# Patient Record
Sex: Male | Born: 1960 | Race: White | Hispanic: No | Smoking: Never smoker
Health system: Southern US, Community
[De-identification: ages and names within clinical notes are randomized; demographics above are authoritative.]

## PROBLEM LIST (undated history)

## (undated) DIAGNOSIS — K429 Umbilical hernia without obstruction or gangrene: Secondary | ICD-10-CM

## (undated) DIAGNOSIS — G4733 Obstructive sleep apnea (adult) (pediatric): Secondary | ICD-10-CM

## (undated) DIAGNOSIS — D751 Secondary polycythemia: Secondary | ICD-10-CM

## (undated) DIAGNOSIS — N4289 Other specified disorders of prostate: Secondary | ICD-10-CM

## (undated) DIAGNOSIS — K2270 Barrett's esophagus without dysplasia: Secondary | ICD-10-CM

## (undated) DIAGNOSIS — J302 Other seasonal allergic rhinitis: Secondary | ICD-10-CM

## (undated) DIAGNOSIS — K219 Gastro-esophageal reflux disease without esophagitis: Secondary | ICD-10-CM

## (undated) DIAGNOSIS — I10 Essential (primary) hypertension: Principal | ICD-10-CM

## (undated) DIAGNOSIS — E291 Testicular hypofunction: Secondary | ICD-10-CM

## (undated) DIAGNOSIS — E7800 Pure hypercholesterolemia, unspecified: Secondary | ICD-10-CM

## (undated) HISTORY — DX: Other specified disorders of prostate: N42.89

## (undated) HISTORY — DX: Other seasonal allergic rhinitis: J30.2

## (undated) HISTORY — DX: Obstructive sleep apnea (adult) (pediatric): G47.33

## (undated) HISTORY — DX: Umbilical hernia without obstruction or gangrene: K42.9

## (undated) HISTORY — DX: Secondary polycythemia: D75.1

## (undated) HISTORY — DX: Essential (primary) hypertension: I10

## (undated) HISTORY — PX: INGUINAL HERNIA REPAIR: SHX194

## (undated) HISTORY — DX: Gastro-esophageal reflux disease without esophagitis: K21.9

## (undated) HISTORY — DX: Pure hypercholesterolemia, unspecified: E78.00

## (undated) HISTORY — DX: Barrett's esophagus without dysplasia: K22.70

## (undated) HISTORY — DX: Testicular hypofunction: E29.1

---

## 1999-09-01 HISTORY — PX: VASECTOMY: SHX75

## 2001-05-01 ENCOUNTER — Emergency Department (HOSPITAL_COMMUNITY): Admission: EM | Admit: 2001-05-01 | Discharge: 2001-05-01 | Payer: Self-pay | Admitting: Emergency Medicine

## 2001-05-03 ENCOUNTER — Ambulatory Visit (HOSPITAL_COMMUNITY): Admission: AD | Admit: 2001-05-03 | Discharge: 2001-05-03 | Payer: Self-pay | Admitting: General Surgery

## 2004-01-27 ENCOUNTER — Emergency Department (HOSPITAL_COMMUNITY): Admission: EM | Admit: 2004-01-27 | Discharge: 2004-01-27 | Payer: Self-pay

## 2004-04-28 ENCOUNTER — Encounter: Admission: RE | Admit: 2004-04-28 | Discharge: 2004-04-28 | Payer: Self-pay | Admitting: Family Medicine

## 2005-11-08 ENCOUNTER — Emergency Department (HOSPITAL_COMMUNITY): Admission: EM | Admit: 2005-11-08 | Discharge: 2005-11-08 | Payer: Self-pay | Admitting: *Deleted

## 2007-01-03 ENCOUNTER — Ambulatory Visit: Payer: Self-pay | Admitting: Family Medicine

## 2007-01-03 ENCOUNTER — Encounter (INDEPENDENT_AMBULATORY_CARE_PROVIDER_SITE_OTHER): Payer: Self-pay | Admitting: Family Medicine

## 2007-01-03 ENCOUNTER — Encounter: Payer: Self-pay | Admitting: Family Medicine

## 2007-01-03 DIAGNOSIS — J309 Allergic rhinitis, unspecified: Secondary | ICD-10-CM | POA: Insufficient documentation

## 2007-01-27 ENCOUNTER — Ambulatory Visit: Payer: Self-pay | Admitting: Family Medicine

## 2007-02-22 IMAGING — CR DG ABDOMEN 2V
3 series · 3 of 3 positions shown · non-contrast
Comparison: none

CLINICAL DATA: Epigastric pain and vomiting

Abdomen 2 view:
No previous for comparison. No free air. Small bowel decompressed. Normal
distribution of gas and stool throughout the nondistended colon. No abnormal
abdominal calcifications. Visualized bones unremarkable.

[w abdomen upright (1 of 2)]
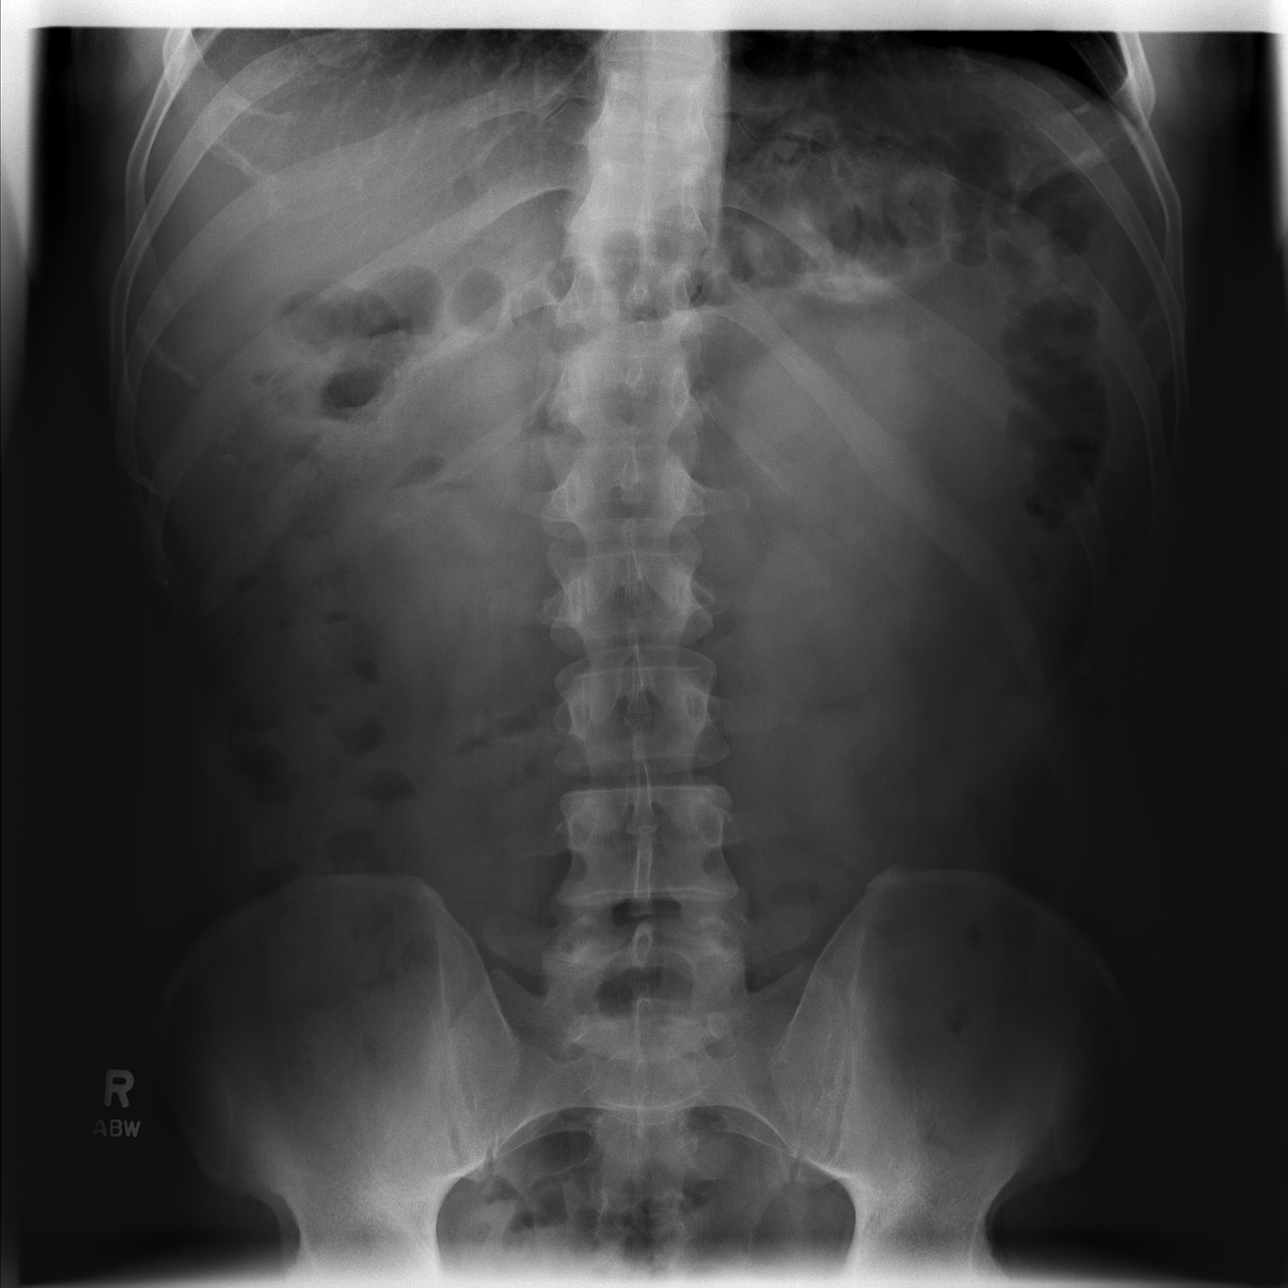

[w abdomen upright (2 of 2)]
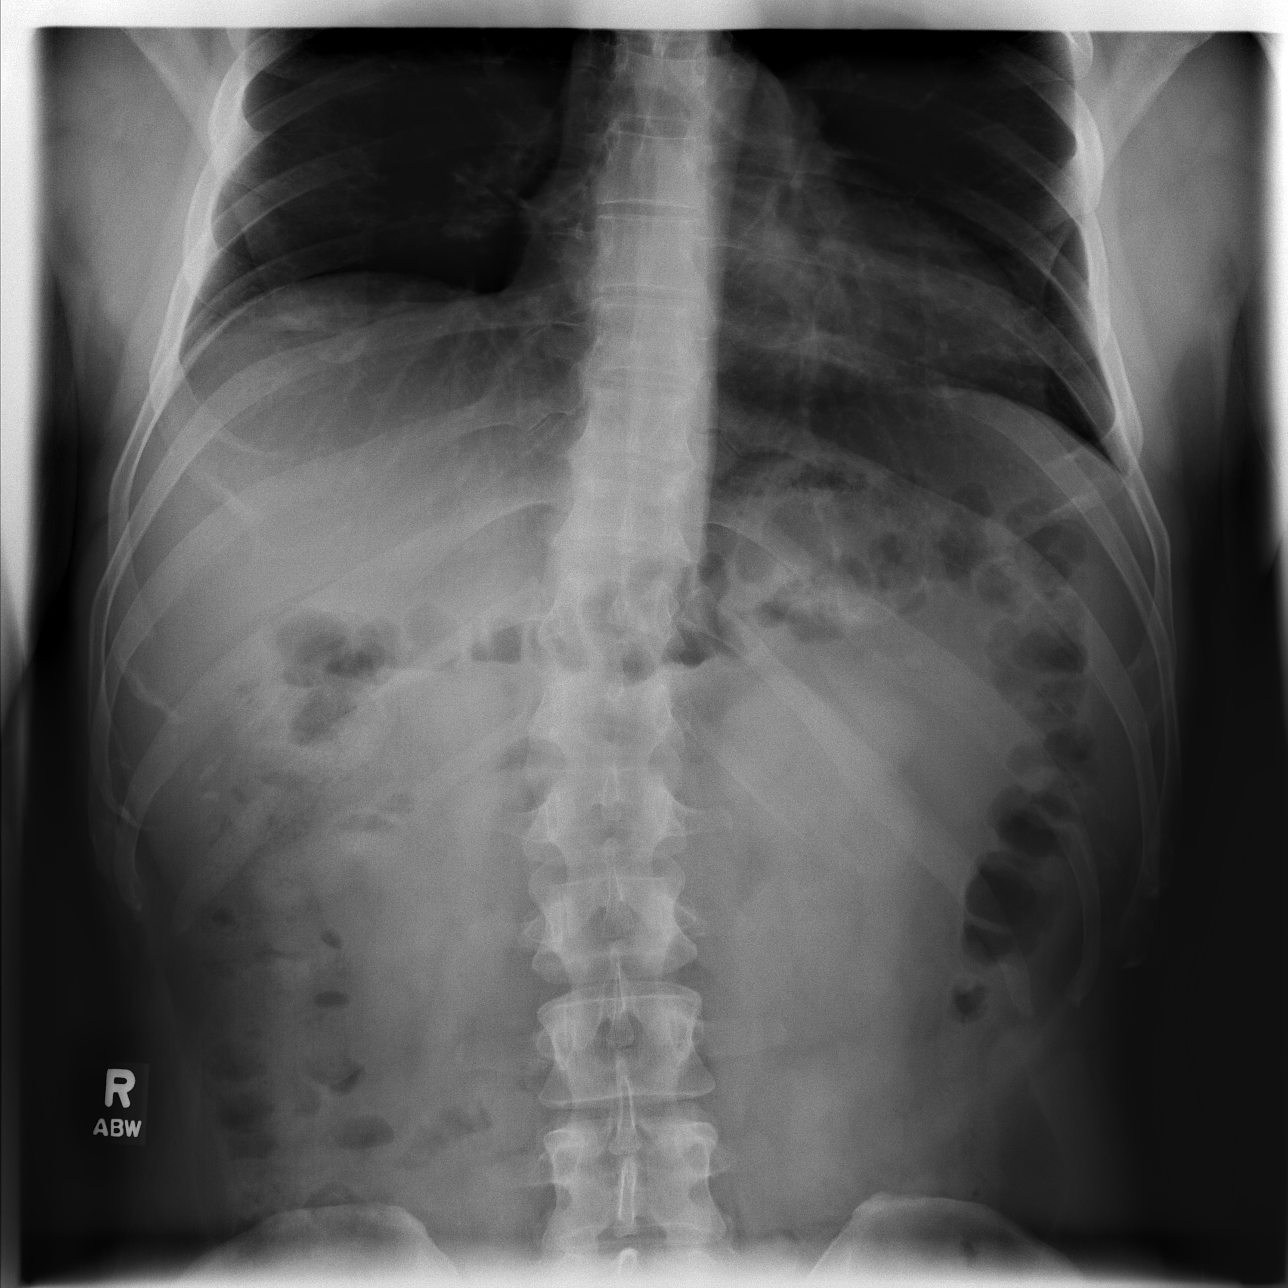

[t abdomen supine]
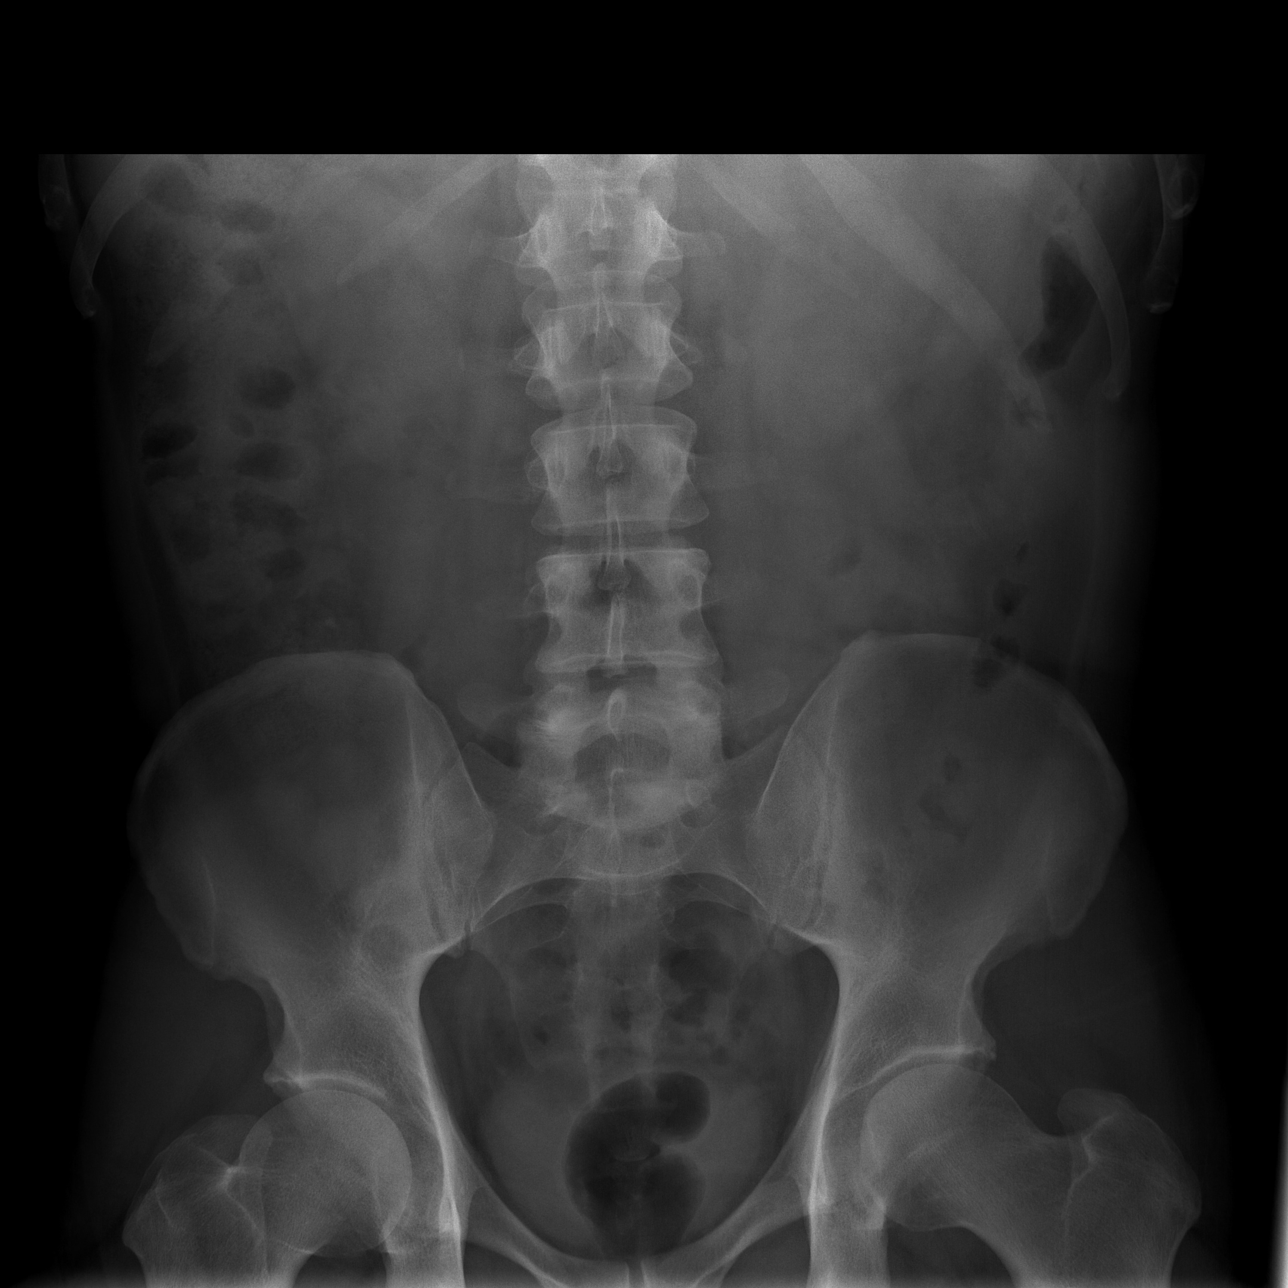

[3 of 3 positions shown; findings below may reference images not displayed]

IMPRESSION: 1. Normal bowel gas pattern. No free air.

## 2007-05-18 ENCOUNTER — Telehealth (INDEPENDENT_AMBULATORY_CARE_PROVIDER_SITE_OTHER): Payer: Self-pay | Admitting: *Deleted

## 2010-03-10 ENCOUNTER — Emergency Department (HOSPITAL_COMMUNITY): Admission: EM | Admit: 2010-03-10 | Discharge: 2010-03-10 | Payer: Self-pay | Admitting: Family Medicine

## 2010-10-22 ENCOUNTER — Encounter: Payer: Self-pay | Admitting: Family Medicine

## 2010-10-22 ENCOUNTER — Ambulatory Visit (INDEPENDENT_AMBULATORY_CARE_PROVIDER_SITE_OTHER): Payer: 59 | Admitting: Family Medicine

## 2010-10-22 DIAGNOSIS — R5383 Other fatigue: Secondary | ICD-10-CM

## 2010-10-22 DIAGNOSIS — M542 Cervicalgia: Secondary | ICD-10-CM | POA: Insufficient documentation

## 2010-10-22 DIAGNOSIS — R5381 Other malaise: Secondary | ICD-10-CM

## 2010-10-22 DIAGNOSIS — S0990XA Unspecified injury of head, initial encounter: Secondary | ICD-10-CM | POA: Insufficient documentation

## 2010-10-27 ENCOUNTER — Encounter: Payer: Self-pay | Admitting: *Deleted

## 2010-10-28 NOTE — Assessment & Plan Note (Signed)
Summary: New pt /dt [employer]   Vital Signs:  Patient profile:   50 year old male Height:      70.5 inches (179.07 cm) Weight:      187.50 pounds (85.23 kg) BMI:     26.62 O2 Sat:      100 % on Room air Temp:     97.7 degrees F (36.50 degrees C) oral Pulse rate:   63 / minute BP sitting:   136 / 87  (right arm) Cuff size:   large  Vitals Entered By: Josph Macho RMA (October 22, 2010 2:41 PM)  O2 Flow:  Room air CC: Fell on Monday, hit back of head on concrete- pt states he vomitted twice afterwards, dull headache, dizzy, pain going down front and back of neck, tired, and irritable/ CF Is Patient Diabetic? No   History of Present Illness: Patient is a 50 yo Caucasian male in today for evaluation of HA and neck pain s/p head trauma. On she was walking his dog on Monday morning February 28 when he slipped on some black ice fell backwards and struck the top of his head as the first thing that had begun. He got up and immediately had a headache and point tenderness with home felt nauseous vomited twice but then says the rest is stable and relatively normally. He never experienced any syncope, memory loss, confusion, visual changes, laceration this, tinnitus or ear pain. He denies any radicular symptoms, chest pain, palpitations, shortness of breath. He is in today because he notes over the last 24 hours his neck has gotten increasingly more uncomfortable. Initially it didn't really bother him denies having pain and spasm down the back of his neck as well as on bilateral sides. Ice and Tylenol provides some rel. He has tenderness to palpation the time of his head but otherwise no significant headache and as previously noted no visual changes. No bony tenderness or any injury to other joints is noted denies any ecchymosis. Is complaining of some intermittent right lower quadrant pain in the region where he had an inguinal hernia repair years ago. This pain is not worsening and he is moving his  bowels daily although he does have to strain at times he denies any bloody or tarry stool, fevers, chills current, recent illness or GU concerns  Preventive Screening-Counseling & Management  Alcohol-Tobacco     Smoking Status: never  Caffeine-Diet-Exercise     Does Patient Exercise: yes  Current Medications (verified): 1)  Multivitamins  Tabs (Multiple Vitamin) .... Once Daily  Allergies (verified): No Known Drug Allergies  Past History:  Past Surgical History: Inguinal herniorrhaphy, on right, mesh in place Vasectomy, 2001  Family History: Father: 46, arthritis Mother: 35, hypotension Siblings:  Brother: 59, A&W Sister:55, A&W Twin Brother: 17, A&W Sister: 71,  A&W Sister: 23, A&W MGM: deceased@95 , old age MGF: deceased@86 , old age PGM: deceased@87 , old age PGF: deceased@86 , old age Children: Son: 13yo, A&W Son: 11yo, A&W  Social History: Occupation:Self employed Married with 2 children Never Smoked Alcohol use-yes:socially Drug use-no Wears seat belt regularly No dietary restrictions Regular exercise-yes, gym, cardio Does Patient Exercise:  yes  Review of Systems  The patient denies anorexia, fever, weight loss, weight gain, vision loss, decreased hearing, hoarseness, chest pain, syncope, dyspnea on exertion, peripheral edema, prolonged cough, headaches, hemoptysis, abdominal pain, melena, hematochezia, severe indigestion/heartburn, hematuria, incontinence, muscle weakness, suspicious skin lesions, transient blindness, difficulty walking, unusual weight change, and enlarged lymph nodes.    Physical Exam  General:  Well-developed,well-nourished,in no acute distress; alert,appropriate and cooperative throughout examination Head:  Normocephalic and atraumatic without obvious abnormalities. No apparent alopecia or balding. Mildly tender over top of scalp on right side Eyes:  No corneal or conjunctival inflammation noted. EOMI. Perrla. Funduscopic exam benign,  without hemorrhages, exudates or papilledema. Vision grossly normal. Ears:  External ear exam shows no significant lesions or deformities.  Otoscopic examination reveals clear canals, tympanic membranes are intact bilaterally without bulging, retraction, inflammation or discharge. Hearing is grossly normal bilaterally. Nose:  External nasal examination shows no deformity or inflammation. Nasal mucosa are pink and moist without lesions or exudates. Mouth:  Oral mucosa and oropharynx without lesions or exudates.  Teeth in good repair. Neck:  No deformities, masses, or tenderness noted. Lungs:  Normal respiratory effort, chest expands symmetrically. Lungs are clear to auscultation, no crackles or wheezes. Heart:  Normal rate and regular rhythm. S1 and S2 normal without gallop, murmur, click, rub or other extra sounds. Abdomen:  Bowel sounds positive,abdomen soft and non-tender without masses, organomegaly or hernias noted. Msk:  No deformity or scoliosis noted of thoracic or lumbar spine.   Pulses:  R and L carotid,dorsalis pedis and posterior tibial pulses are full and equal bilaterally Extremities:  No clubbing, cyanosis, edema, or deformity noted with normal full range of motion of all joints.   Neurologic:  No cranial nerve deficits noted. Station and gait are normal. Plantar reflexes are down-going bilaterally. DTRs are symmetrical throughout. Sensory, motor and coordinative functions appear intact. Skin:  Intact without suspicious lesions or rashes Cervical Nodes:  No lymphadenopathy noted Psych:  Cognition and judgment appear intact. Alert and cooperative with normal attention span and concentration. No apparent delusions, illusions, hallucinations   Impression & Recommendations:  Problem # 1:  HEAD TRAUMA, BLUNT (ICD-959.01) Patient fell on the ice on 10/20/2010 while walking his dogs, he slipped and his feet went out and he hit the top of his head very hard, initially he experienced some  nauseaand vomitting immediately but then that subsided. He never experienced any syncope, confusion, memory loss, visual changes. Over the past 24 hours his biggest concern has been increased neck pain. Seek immediate care if memory loss, visual changes, worsening HA, syncope or any other concerns, will hold off on CT scan unless symptoms worsen  Problem # 2:  NECK PAIN, ACUTE (ICD-723.1)  His updated medication list for this problem includes:    Meloxicam 15 Mg Tabs (Meloxicam) .Marland Kitchen... 1 tab by mouth daily as needed pain    Cyclobenzaprine Hcl 10 Mg Tabs (Cyclobenzaprine hcl) .Marland Kitchen... 1 tab by mouth at bedtime as needed pain  Orders: T-Cervical Spine Comp w/Flex & Ext (16109UE)  Whiplash, recommend alternate ice and heat and avoid any strenuous activities until xray is complete. Seek immediate care if any radicular symptoms or worsening pain develops.  Problem # 3:  FATIGUE (ICD-780.79) Patient is encouraged to get annual labs including FLP, CMP, CBC, TSH, PSA and testosterone levels checked, he agrees to call when he is ready to proceed, he is also encouraged to proceed with screening colonoscopy and he will notify us if he would like to proceed  Complete Medication List: 1)  Multivitamins Tabs (Multiple vitamin) .... Once daily 2)  Meloxicam 15 Mg Tabs (Meloxicam) .Marland Kitchen.. 1 tab by mouth daily as needed pain 3)  Cyclobenzaprine Hcl 10 Mg Tabs (Cyclobenzaprine hcl) .Marland Kitchen.. 1 tab by mouth at bedtime as needed pain  Patient Instructions: 1)  Please schedule a follow-up appointment in  1 year for annual exam 2)  Alternate ice and heat, seek immediate care if worsening HA, neurologic changes occur 3)  Call for labs, including FLP, CMP, CBC, testosterone and PSA Prescriptions: CYCLOBENZAPRINE HCL 10 MG TABS (CYCLOBENZAPRINE HCL) 1 tab by mouth at bedtime as needed pain  #30 x 1   Entered and Authorized by:   Danise Edge MD   Signed by:   Danise Edge MD on 10/22/2010   Method used:   Electronically to          [facility]  [place] [address] (retail)       [address]       [place], [place]  [address]       Ph: [phone] or [phone]       Fax: [phone]   RxID:   [id number] MELOXICAM 15 MG TABS (MELOXICAM) 1 tab by mouth daily as needed pain  #30 x 1   Entered and Authorized by:   Danise Edge MD   Signed by:   Danise Edge MD on 10/22/2010   Method used:   [facility]ronically to        [facility]  [place] [address] (retail)       [address]       [place], [place]  [address]       Ph: [phone] or [phone]       Fax: [phone]   RxID:   [id number]    Orders Added: 1)  T-Cervical Spine Comp w/Flex & Ext [72052TC] 2)  New Patient Level III [25427]    Preventive Care Screening  Last Tetanus Booster:    Date:  09/01/2007    Results:  Historical

## 2010-11-13 ENCOUNTER — Telehealth: Payer: Self-pay | Admitting: Family Medicine

## 2010-11-14 ENCOUNTER — Telehealth: Payer: Self-pay | Admitting: Family Medicine

## 2010-11-18 NOTE — Progress Notes (Signed)
Summary: Wants referral  Phone Note Call from Patient Call back at [phone]   Caller: Patient Reason for Call: Talk to Nurse Summary of Call: Pt is still not feeling "right", feels like he needs to be referred to a specialist Initial call taken by: Lannette Donath,  November 13, 2010 2:09 PM  Follow-up for Phone Call        OK but need to know what he means, is it the HA, is it neck pain is he having trouble with confusion or something else altogether. He was supposed to go for a  neck xray did he? and if he is still having HA or mentation difficulties he needs a CT head while he waits on Neuro referral Follow-up by: Danise Edge MD,  November 14, 2010 9:01 AM  Additional Follow-up for Phone Call Additional follow up Details #1::        No pt did not go for the xray. Pt states the pain (in his neck) got better and then started hurting again.  Additional Follow-up by: Josph Macho RMA,  November 14, 2010 10:20 AM    Additional Follow-up for Phone Call Additional follow up Details #2::    So is the neck whathe wants the referral for? If so I can send him to ortho but he should still get an xray while he is waiting for the appt because it will speed his referral along when he gets there. Follow-up by: Danise Edge MD,  November 14, 2010 11:00 AM  Additional Follow-up for Phone Call Additional follow up Details #3:: Details for Additional Follow-up Action Taken: Left pt a detailed message informing him of the importance of getting the xray.  referral is done, SAB Additional Follow-up by: Josph Macho RMA,  November 14, 2010 11:17 AM

## 2010-11-18 NOTE — Telephone Encounter (Signed)
Pt informed to go to [employer] [facility]

## 2010-11-18 NOTE — Telephone Encounter (Signed)
Left a message for pt to return my call. 

## 2011-01-16 NOTE — Op Note (Signed)
[facility]  Patient:    Ryan Dodson, Ryan Dodson Visit Number: [id number] MRN: [id number]          Service Type: DSU Location: DAY Attending Physician:  Carson Myrtle Proc. Date: 05/03/01 Admit Date:  05/03/2001                             Operative Report  PREOPERATIVE DIAGNOSIS:  Acute right inguinal hernia.  POSTOPERATIVE DIAGNOSIS:  Right direct inguinal hernia.  OPERATION PERFORMED:   Repair with mesh.  SURGEON:  Timothy E. Earlene Plater, M.D.  ANESTHESIA:  General.  INDICATIONS FOR PROCEDURE:  Mr. Cassatt presented in the office three days ago with acute nauseating pain in the right groin. A hernia was easily diagnosable. It did not appear to be incarcerated and certainly not strangulated. He was placed on restrictions and the surgery was scheduled urgently at his request. This has been carefully explained to him in detail as well as his wife.  DESCRIPTION OF PROCEDURE:  The patient was brought to the operating room, placed supine, general endotracheal anesthesia administered. The right groin was shaved, prepped and draped in the usual fashion. A curvilinear incision made after Marcaine 0.5% with epinephrine was injected throughout the field. The subcutaneous tissue dissected, the bleeding points cauterized, the external oblique dissected in line with its fibers and opened through the external ring. It was reflected medial and lateral with care taken to reflect the ilioinguinal nerve with the medial leaf and it was kept out of the dissection. The cord structures appeared normal and were dissected from the floor of the canal. The floor of the canal was moderately herniated. At the neck of the internal ring, there was a large lipoma. This was dissected back to the tip of the peritoneum which was clearly seen and there was on indirect sac. This lipoma was reduced. A small plug of mesh was placed in the anterior medial aspect of the internal ring and  sewn in place with interrupted 2-0 Prolenes. A flat piece of mesh was cut and fashioned to fit the floor of the canal and tied down to the surrounding with sturdy structures with interrupted #0 Prolene. This completed the repair. The cord passed through the mesh and lay in its anatomic position. The ilioinguinal nerve had been avoided. The external oblique was closed with running 2-0 Vicryl, deep subcu 2-0 Vicryl, skin 3-0 monocryl subcuticular. Counts correct. The patient tolerated the procedure well. The 1/2 inch Steri-Strips and dry sterile dressings applied. Second count is correct. He was awakened and taken to the recovery room in good condition. Attending Physician:  Carson Myrtle DD:  05/03/01 TD:  05/03/01 Job: [id number]

## 2011-01-27 ENCOUNTER — Telehealth: Payer: Self-pay | Admitting: Family Medicine

## 2011-01-27 NOTE — Telephone Encounter (Signed)
Neysa Bonito, please get the CPT code for the test from Dr Abner Greenspan, patient would like a quote for the price of the test

## 2011-01-27 NOTE — Telephone Encounter (Signed)
Patient said Dr Abner Greenspan had mentioned having his testosterone levels checked, please let patient know if he needs to make an appt with the doctor or whether he needs a lab visit

## 2011-01-28 NOTE — Telephone Encounter (Signed)
Please advise 

## 2011-01-28 NOTE — Telephone Encounter (Signed)
MD states he can make a lab appt

## 2011-01-28 NOTE — Telephone Encounter (Signed)
ICD9 for testosterone check will be 780.9/fatigue

## 2011-01-28 NOTE — Telephone Encounter (Signed)
Please print the note from [employer], it did not transfer over, I will need to see that to get the ICD 9 code

## 2011-02-03 NOTE — Telephone Encounter (Signed)
I spoke with Ryan Dodson, she said the charge for CPT 364.15 venipuncture is $12 and CPT 364.16 finger stick is also $12, but the patient will also incur a charge from [employer]

## 2011-02-03 NOTE — Telephone Encounter (Signed)
I spoke with patient, he scheduled lab appt, patient is aware he will also get a bill from [employer]

## 2011-02-04 NOTE — Telephone Encounter (Signed)
Patient has been advised that we cannot get a price from [employer]

## 2011-02-05 ENCOUNTER — Ambulatory Visit: Payer: 59

## 2011-02-05 DIAGNOSIS — E291 Testicular hypofunction: Secondary | ICD-10-CM

## 2011-02-06 MED ORDER — TESTOSTERONE CYPIONATE 100 MG/ML IM SOLN: 100.0000 mg | INTRAMUSCULAR | Status: DC

## 2011-02-09 ENCOUNTER — Ambulatory Visit (INDEPENDENT_AMBULATORY_CARE_PROVIDER_SITE_OTHER): Payer: 59 | Admitting: Family Medicine

## 2011-02-09 ENCOUNTER — Encounter: Payer: Self-pay | Admitting: Family Medicine

## 2011-02-09 VITALS — BP 146/82 | HR 92 | Temp 98.0°F | Ht 70.5 in | Wt 184.8 lb

## 2011-02-09 DIAGNOSIS — E291 Testicular hypofunction: Secondary | ICD-10-CM

## 2011-02-09 DIAGNOSIS — R5381 Other malaise: Secondary | ICD-10-CM

## 2011-02-09 DIAGNOSIS — E349 Endocrine disorder, unspecified: Secondary | ICD-10-CM | POA: Insufficient documentation

## 2011-02-09 DIAGNOSIS — Z8639 Personal history of other endocrine, nutritional and metabolic disease: Secondary | ICD-10-CM

## 2011-02-09 DIAGNOSIS — R5383 Other fatigue: Secondary | ICD-10-CM

## 2011-02-09 HISTORY — DX: Testicular hypofunction: E29.1

## 2011-02-09 MED ORDER — TESTOSTERONE CYPIONATE 100 MG/ML IM SOLN
100.0000 mg | INTRAMUSCULAR | Status: DC
  Administered 2011-02-09: 100 mg via INTRAMUSCULAR

## 2011-02-09 NOTE — Assessment & Plan Note (Signed)
Likely related to his low testosterone levels, will stasrt therapy and monitor

## 2011-02-09 NOTE — Assessment & Plan Note (Addendum)
Given testosterone dose today, 100mg  IM now, will give another shot in 2 weeks and then reassess symptoms and consider going to weekly shots after that. Patient is aware of potential side effects and would like to proceed. Patient may choose to administer his own shots in the future if he tolerates the dose.

## 2011-02-09 NOTE — Progress Notes (Signed)
Ryan Dodson [id number] Sep 19, 1960 02/09/2011      Progress Note-Follow Up  Subjective  Chief Complaint  Chief Complaint  Patient presents with  . Follow-up    low testosterone f/up and injection    HPI  Patient is in today for first Testosterone injection. Testosterone level last week was in the 240s and he is anxious to start shots. He acknowledges that it back in the 1980s when he was weight lifting he was doing testosterone injections himself. He reports he tolerated it well and he had more energy than he has had any other time in his life. He denies any recent history of fevers, chills, headache, chest pain, palpitations, shortness of breath, GI or GU complaints at this time.  Past Medical History  Diagnosis Date  . Allergy     Rhinitis  . 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 Vasectomy 2001  . Inguinal hernia repair     on right, mesh in place    Family History  Problem Relation Age of Onset  . Hypotension Mother   . Arthritis Father     History   Social History  . Marital Status: Married    Spouse Name: N/A    Number of Children: N/A  . Years of Education: N/A   Occupational History  . Not on file.   Social History Main Topics  . Smoking status: Never Smoker   . Smokeless tobacco: Not on file  . Alcohol Use: 0.0 oz/week    0 drink(s) per week     socially  . Drug Use: No  . Sexually Active: Not on file   Other Topics Concern  . Not on file   Social History Narrative  . No narrative on file    Current Outpatient Prescriptions on File Prior to Visit  Medication Sig Dispense Refill  . cyclobenzaprine (FLEXERIL) 10 MG tablet Take 10 mg by mouth at bedtime as needed.        . meloxicam (MOBIC) 15 MG tablet Take 15 mg by mouth daily as needed. For pain       . multivitamin (THERAGRAN) per tablet Take 1 tablet by mouth daily.        Marland Kitchen testosterone cypionate (DEPOTESTOTERONE CYPIONATE) 100 MG/ML injection Inject 1  mL (100 mg total) into the muscle every 14 (fourteen) days. 1 ml given X 8 weeks.  10 mL  0   No current facility-administered medications on file prior to visit.    Not on File  Review of Systems  Review of Systems  Constitutional: Negative for fever and malaise/fatigue.  HENT: Negative for congestion.   Eyes: Negative for discharge.  Respiratory: Negative for shortness of breath.   Cardiovascular: Negative for chest pain, palpitations and leg swelling.  Gastrointestinal: Negative for nausea, abdominal pain and diarrhea.  Genitourinary: Negative for dysuria.  Musculoskeletal: Negative for falls.  Skin: Negative for rash.  Neurological: Negative for loss of consciousness and headaches.  Endo/Heme/Allergies: Negative for polydipsia.  Psychiatric/Behavioral: Negative for depression and suicidal ideas. The patient is not nervous/anxious and does not have insomnia.     Objective  BP 146/82  Pulse 92  Temp(Src) 98 F (36.7 C) (Oral)  Ht 5' 10.5" (1.791 m)  Wt 184 lb 12.8 oz (83.825 kg)  BMI 26.14 kg/m2  SpO2 98%  Physical Exam  Physical Exam  Constitutional: He is oriented to person, place, and time and well-developed, well-nourished, and in no distress. No distress.  HENT:  Head: Normocephalic and atraumatic.  Eyes: Conjunctivae are normal.  Neck: Neck supple. No thyromegaly present.  Cardiovascular: Normal rate, regular rhythm and normal heart sounds.   No murmur heard. Pulmonary/Chest: Effort normal and breath sounds normal. No respiratory distress.  Abdominal: He exhibits no distension and no mass. There is no tenderness.  Musculoskeletal: He exhibits no edema.  Neurological: He is alert and oriented to person, place, and time.  Skin: Skin is warm.  Psychiatric: Memory, affect and judgment normal.      Assessment & Plan  Low testosterone Given testosterone dose today, 100mg  IM now, will give another shot in 2 weeks and then reassess symptoms and consider going  to weekly shots after that. Patient is aware of potential side effects and would like to proceed. Patient may choose to administer his own shots in the future if he tolerates the dose.  FATIGUE Likely related to his low testosterone levels, will stasrt therapy and monitor

## 2011-02-11 MED ORDER — TESTOSTERONE CYPIONATE 100 MG/ML IM SOLN: 100.0000 mg | INTRAMUSCULAR | Status: DC

## 2011-02-23 ENCOUNTER — Telehealth: Payer: Self-pay

## 2011-02-23 ENCOUNTER — Ambulatory Visit (INDEPENDENT_AMBULATORY_CARE_PROVIDER_SITE_OTHER): Payer: 59

## 2011-02-23 DIAGNOSIS — E291 Testicular hypofunction: Secondary | ICD-10-CM

## 2011-02-23 MED ORDER — TESTOSTERONE CYPIONATE 100 MG/ML IM SOLN
100.0000 mg | INTRAMUSCULAR | Status: DC
  Administered 2011-02-23: 100 mg via INTRAMUSCULAR

## 2011-02-23 NOTE — Telephone Encounter (Signed)
Pt was in today for his 2nd testosterone injection. Pt would like to know if he should go to once weekly starting next week? Should he come in for labs to see where his numbers are?

## 2011-02-23 NOTE — Telephone Encounter (Signed)
Not worth his time to come in for a repeat level yet. Would wait at least until mid July but as long as he has not any trouble with his initial dose, he can certainly increase dosing to weekly but keep the strength the same

## 2011-02-23 NOTE — Telephone Encounter (Signed)
Left a detailed message on pt's voicemail  

## 2011-03-02 ENCOUNTER — Telehealth: Payer: Self-pay | Admitting: *Deleted

## 2011-03-02 ENCOUNTER — Ambulatory Visit (INDEPENDENT_AMBULATORY_CARE_PROVIDER_SITE_OTHER): Payer: 59 | Admitting: *Deleted

## 2011-03-02 DIAGNOSIS — E291 Testicular hypofunction: Secondary | ICD-10-CM

## 2011-03-02 MED ORDER — TESTOSTERONE CYPIONATE 100 MG/ML IM SOLN
100.0000 mg | Freq: Once | INTRAMUSCULAR | Status: AC
  Administered 2011-03-02: 100 mg via INTRAMUSCULAR

## 2011-03-02 NOTE — Telephone Encounter (Signed)
Pt was in today for testosterone injection.  Pt wants to know if he also needs HCG level checked and possible supplementation.  Advised pt I would ask and Dr. Abner Greenspan will be back in the office on 03/09/11. Pt will be coming for his next injection on that day.  Pt is aware Dr. Abner Greenspan out of the office until 03/09/11 and does not need an answer prior to that time.

## 2011-03-09 NOTE — Telephone Encounter (Signed)
So if he wants to have one drawn we can do that but I do not generally run them on men and I never manage them in med. So my two concerns are one will his insurance consider it warranted and thus pay for it and my second concern is if it is off he would have to be referred for management. As long he is OK with those two things I will put in an order to have it drawn.

## 2011-03-09 NOTE — Telephone Encounter (Signed)
I have attempted to contact this patient by phone with the following results: left message to return my call on answering machine (home).  Out of office reply on mobile voicemail.

## 2011-03-17 ENCOUNTER — Ambulatory Visit (INDEPENDENT_AMBULATORY_CARE_PROVIDER_SITE_OTHER): Payer: 59

## 2011-03-17 DIAGNOSIS — E291 Testicular hypofunction: Secondary | ICD-10-CM

## 2011-03-17 MED ORDER — TESTOSTERONE CYPIONATE 100 MG/ML IM SOLN
100.0000 mg | INTRAMUSCULAR | Status: DC
  Administered 2011-03-17: 100 mg via INTRAMUSCULAR

## 2011-03-26 NOTE — Telephone Encounter (Signed)
Pt did not return call and unable to reach at mobile number.  Pt made appt while at last injection appt.  Follow up ended.

## 2011-03-31 ENCOUNTER — Ambulatory Visit: Payer: 59

## 2011-03-31 ENCOUNTER — Encounter: Payer: Self-pay | Admitting: Family Medicine

## 2011-03-31 ENCOUNTER — Ambulatory Visit (INDEPENDENT_AMBULATORY_CARE_PROVIDER_SITE_OTHER): Payer: 59 | Admitting: Family Medicine

## 2011-03-31 VITALS — BP 124/84 | HR 70 | Temp 98.0°F | Ht 70.5 in | Wt 190.8 lb

## 2011-03-31 DIAGNOSIS — E349 Endocrine disorder, unspecified: Secondary | ICD-10-CM

## 2011-03-31 DIAGNOSIS — R5383 Other fatigue: Secondary | ICD-10-CM

## 2011-03-31 DIAGNOSIS — R5381 Other malaise: Secondary | ICD-10-CM

## 2011-03-31 DIAGNOSIS — E291 Testicular hypofunction: Secondary | ICD-10-CM

## 2011-03-31 MED ORDER — "NEEDLE (DISP) 22G X 1-1/2"" MISC": Status: DC

## 2011-03-31 MED ORDER — TESTOSTERONE CYPIONATE 100 MG/ML IM SOLN
100.0000 mg | Freq: Once | INTRAMUSCULAR | Status: AC
  Administered 2011-03-31: 100 mg via INTRAMUSCULAR

## 2011-03-31 NOTE — Assessment & Plan Note (Signed)
Patient notes overall great improvement in his mood, energy and his overall feeling of good health since we started his testosterone injections. 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 He is notified that this practitioner does not have any personal x-rays with hCG and does not prescribe it. He is given a referral to urology so we can discuss his options were fully. He is given his standard testosterone injection today we will recheck his level in about 3 weeks. He would like to start his injections at home and this will be provided. Now that he has had multiple injections and tolerated them well he is instructed on techniques at today's visit and will call or followup if he has any further questions

## 2011-03-31 NOTE — Assessment & Plan Note (Signed)
Patient has yet to have fasting labs done for baseline here. He declines today but he says he is about to all those labs drawn by his insurance company for by insurance. He agrees to provide [place] with a copy of those labs so that we have baseline labs available for him the fatigue is notably better since starting testosterone.

## 2011-03-31 NOTE — Progress Notes (Signed)
Ryan Dodson [id number] 1960-10-21 03/31/2011      Progress Note-Follow Up  Subjective  Chief Complaint  Chief Complaint  Patient presents with  . Follow-up    discuss HCG  . Injections    testosterone    HPI  Patient is a 50 year old Caucasian male who is in today for his biweekly testosterone injection. He is in today to request that we start prescribing hCG to him. 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 He had spoken with some acquaintances who had suggested beta hCG. No other physical complaints no recent illness, fevers, chills, headache, chest pain, palpitations, shortness of breath, GI or GU complaints noted today.  Past Medical History  Diagnosis Date  . Allergy     Rhinitis  . 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 Vasectomy 2001  . Inguinal hernia repair     on right, mesh in place    Family History  Problem Relation Age of Onset  . Hypotension Mother   . Arthritis Father     History   Social History  . Marital Status: Married    Spouse Name: N/A    Number of Children: N/A  . Years of Education: N/A   Occupational History  . Not on file.   Social History Main Topics  . Smoking status: Never Smoker   . Smokeless tobacco: Former Neurosurgeon    Quit date: 08/31/1986  . Alcohol Use: 0.0 oz/week    0 drink(s) per week     socially  . Drug Use: No  . Sexually Active: Not on file   Other Topics Concern  . Not on file   Social History Narrative  . No narrative on file    Current Outpatient Prescriptions on File Prior to Visit  Medication Sig Dispense Refill  . testosterone cypionate (DEPOTESTOTERONE CYPIONATE) 100 MG/ML injection Inject 1 mL (100 mg total) into the muscle every 14 (fourteen) days. 1 ml given X 8 weeks.  10 mL  0  .  multivitamin (THERAGRAN) per tablet Take 1 tablet by mouth daily.         No current facility-administered medications on file prior to visit.    No Known Allergies  Review of Systems  Review of Systems  Constitutional: Negative for fever and malaise/fatigue.  HENT: Negative for congestion.   Eyes: Negative for discharge.  Respiratory: Negative for shortness of breath.   Cardiovascular: Negative for chest pain, palpitations and leg swelling.  Gastrointestinal: Negative for nausea, abdominal pain and diarrhea.  Genitourinary: Negative for dysuria, urgency and frequency.  Musculoskeletal: Negative for falls.  Skin: Negative for rash.  Neurological: Negative for loss of consciousness and headaches.  Endo/Heme/Allergies: Negative for polydipsia.  Psychiatric/Behavioral: Negative for depression and suicidal ideas. The patient is not nervous/anxious and does not have insomnia.     Objective  BP 139/88  Pulse 70  Temp(Src) 98 F (36.7 C) (Oral)  Ht 5' 10.5" (1.791 m)  Wt 190 lb 12.8 oz (86.546 kg)  BMI 26.99 kg/m2  SpO2 97%  Physical Exam  Physical Exam  Constitutional: He is oriented to person, place, and time and well-developed, well-nourished, and in no distress. No distress.       Repeat BP 124/84  HENT:  Head: Normocephalic and atraumatic.  Eyes: Conjunctivae are  normal.  Neck: Neck supple. No thyromegaly present.  Cardiovascular: Normal rate, regular rhythm and normal heart sounds.   No murmur heard. Pulmonary/Chest: Effort normal and breath sounds normal. No respiratory distress.  Abdominal: He exhibits no distension and no mass. There is no tenderness.  Musculoskeletal: He exhibits no edema.  Neurological: He is alert and oriented to person, place, and time.  Skin: Skin is warm.  Psychiatric: Memory, affect and judgment normal.      Assessment & Plan  Low testosterone Patient notes overall great improvement in his mood, energy and his overall feeling of  good health since we started his testosterone injections. 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 He is notified that this practitioner does not have any personal x-rays with hCG and does not prescribe it. He is given a referral to urology so we can discuss his options were fully. He is given his standard testosterone injection today we will recheck his level in about 3 weeks. He would like to start his injections at home and this will be provided. Now that he has had multiple injections and tolerated them well he is instructed on techniques at today's visit and will call or followup if he has any further questions  FATIGUE Patient has yet to have fasting labs done for baseline here. He declines today but he says he is about to all those labs drawn by his insurance company for by insurance. He agrees to provide [place] with a copy of those labs so that we have baseline labs available for him the fatigue is notably better since starting testosterone.

## 2011-04-06 ENCOUNTER — Ambulatory Visit (INDEPENDENT_AMBULATORY_CARE_PROVIDER_SITE_OTHER): Payer: 59 | Admitting: Family Medicine

## 2011-04-06 ENCOUNTER — Encounter: Payer: Self-pay | Admitting: Family Medicine

## 2011-04-06 VITALS — BP 139/87 | HR 91 | Temp 98.6°F | Ht 70.5 in | Wt 189.8 lb

## 2011-04-06 DIAGNOSIS — Z0000 Encounter for general adult medical examination without abnormal findings: Secondary | ICD-10-CM

## 2011-04-06 DIAGNOSIS — R1011 Right upper quadrant pain: Secondary | ICD-10-CM

## 2011-04-06 DIAGNOSIS — K219 Gastro-esophageal reflux disease without esophagitis: Secondary | ICD-10-CM | POA: Insufficient documentation

## 2011-04-06 DIAGNOSIS — K429 Umbilical hernia without obstruction or gangrene: Secondary | ICD-10-CM

## 2011-04-06 DIAGNOSIS — R1013 Epigastric pain: Secondary | ICD-10-CM | POA: Insufficient documentation

## 2011-04-06 HISTORY — DX: Gastro-esophageal reflux disease without esophagitis: K21.9

## 2011-04-06 HISTORY — DX: Umbilical hernia without obstruction or gangrene: K42.9

## 2011-04-06 LAB — CBC WITH DIFFERENTIAL/PLATELET
Basophils Absolute: 0 10*3/uL (ref 0.0–0.1)
Basophils Relative: 0 % (ref 0–1)
Hemoglobin: 16.4 g/dL (ref 13.0–17.0)
Lymphocytes Relative: 18 % (ref 12–46)
MCH: 31.5 pg (ref 26.0–34.0)
MCHC: 33.7 g/dL (ref 30.0–36.0)
MCV: 93.5 fL (ref 78.0–100.0)
Neutro Abs: 6.6 10*3/uL (ref 1.7–7.7)
Neutrophils Relative %: 71 % (ref 43–77)
RBC: 5.2 MIL/uL (ref 4.22–5.81)

## 2011-04-06 MED ORDER — RANITIDINE HCL 300 MG PO TABS: 300.0000 mg | ORAL_TABLET | Freq: Every day | ORAL | Status: DC

## 2011-04-06 NOTE — Patient Instructions (Signed)
Costochondritis (Costochondral Separation / Tietze Syndrome) Costochondritis (Tietze syndrome), or costochondral separation, is a swelling and irritation (inflammation) of the tissue (cartilage) that connects your ribs with your breastbone (sternum). It may occur on its own (spontaneously), through damage caused by an accident (trauma), or simply from coughing or minor exercise. It may take up to 6 weeks to get better and longer if you are unable to be conservative in your activities. HOME CARE INSTRUCTIONS Avoid exhausting physical activity. Try not to strain your ribs during normal activity. This would include any activities using chest, belly (abdominal) and side muscles, especially if heavy weights are used.  Use ice for 15 minutes per hour while awake for the first 2 days. Place the ice in a plastic bag, and place a towel between the bag of ice and your skin.  Only take over-the-counter or prescription medicines for pain, discomfort, or fever as directed by your caregiver.  SEEK IMMEDIATE MEDICAL CARE IF: Your pain increases or you are very uncomfortable.  An oral temperature above 104 develops.  You develop difficulty with your breathing.  You cough up blood.  You develop worse chest pains, shortness of breath, sweating, or vomiting.  You develop new, unexplained problems (symptoms).  MAKE SURE YOU:  Understand these instructions.  Will watch your condition.  Will get help right away if you are not doing well or get worse.  Document Released: 05/27/2005 Document Re-Released: 11/11/2009 [facility] Patient Information 2011 [place], [place].Cholelithiasis, Gallbladder Disease (Gallstones) Gallstones are a form of gallbladder disease. When there is an infection of the gallbladder it is called cholecystitis. It is usually caused by a build-up of stones (gallstones or cholelithiasis) in your gallbladder. The gallbladder is not an essential organ. This means it is not necessary for life. It is  located slightly to the right of center in the belly (abdomen), behind the liver. It stores bile made in the liver. Bile aids in digestion and absorption of fats. Gallbladder disease may result in feeling sick to your stomach (nausea), abdominal pain, and jaundice. In severe cases, emergency surgery may be required. Gallstones are the most common type of gallbladder disease. They begin as small crystals and slowly grow into stones. Gallstone pain occurs when the gallbladder spasms and a gallstone is blocking the duct. Pain can also occur when a stone passes out of the duct. The pain usually begins suddenly. It may persist from several minutes to several hours. Infection can occur. Infection can add to discomfort and severity of an acute attack. The pain may be made worse by breathing deeply or by being jarred. There may be fever and tenderness to the touch. In some cases, when gallstones do not move into the bile duct, people have no pain or symptoms. These are called "silent" gallstones. Women are three times more likely to develop gallstones than men. Women who have had several pregnancies are more likely to have gallbladder disease. Physicians sometimes advise removing diseased gallbladders before future pregnancies. Other factors that increase the risk of gallbladder disease are obesity, diets heavy in fried foods and dairy products, increasing age, prolonged use of medications containing male hormones, and heredity. HOME CARE INSTRUCTIONS  Only take over-the-counter or prescription medicines for pain, discomfort, or fever as directed by your caregiver.   Follow a low fat diet until seen again. (Fat causes the gallbladder to contract.)   Follow-up as instructed. Attacks are almost always recurrent and surgery is usually required for permanent treatment.  SEEK IMMEDIATE MEDICAL CARE IF:  Pain is increasing and is not controlled by medications.   You have an oral temperature above 104, not  controlled by medication.   You develop nausea and vomiting.  MAKE SURE YOU:   Understand these instructions.   Will watch your condition. 104  Will get help right away if you are not doing well or get worse.  Document Released: 08/13/2005 Document Re-Released: 07/30/2008 [facility] Patient Information 2011 [place], [place].Heartburn Heartburn is a painful, burning sensation in the chest. It may feel worse in certain positions, such as lying down or bending over. It is caused by stomach acid backing up into the tube that carries food from the mouth down to the stomach (lower esophagus).  CAUSES A number of conditions can cause or worsen heartburn, including:   Pregnancy.   Being overweight (obesity).   A condition called hiatal hernia, in which part or all of the stomach is moved up into the chest through a weakness in the diaphragm muscle.   Alcohol.   Exercise.   Eating just before going to bed.   Overeating.   Medications, including:   Nonsteroidal anti-inflammatory drugs, such as ibuprofen and naproxen.   Aspirin.   Some blood pressure medicines, including beta-blockers, calcium channel blockers, and alpha-blockers.   Nitrates (used to treat angina).   The asthma medication Theophylline.   Certain sedative drugs.   Heartburn may be worse after eating certain foods. These heartburn-causing foods are different for different people, but may include:   Peppers.   Chocolate.   Coffee.   High-fat foods, including fried foods.   Spicy foods.   Garlic, onions.   Citrus fruits, including oranges, grapefruit, lemons and limes.   Food containing tomatoes or tomato products.   Mint.   Carbonated beverages.   Vinegar.  SYMPTOMS  Symptoms may last for a few minutes or a few hours, and can include:  Burning pain in the chest or lower throat.   Bitter taste in the mouth.   Coughing.  DIAGNOSIS If the usual treatments for heartburn do not improve your  symptoms, then tests may be done to see if there is another condition present. Possible tests may include:  X-rays.   Endoscopy. This is when a tube with a light and a camera on the end is used to examine the esophagus and the stomach.   Blood, breath, or stool tests may be used to check for bacteria that cause ulcers.  TREATMENT There are a number of non-prescription medicines used to treat heartburn, including:  Antacids.   Acid reducers (also called H-2 blockers).   Proton-pump inhibitors.  HOME CARE INSTRUCTIONS  Raise the head of your bed by putting blocks under the legs.   Eat 2-3 hours before going to bed.   Stop smoking.   Try to reach and maintain a healthy weight.   Do not eat just a few very large meals. Instead, eat many smaller meals throughout the day.   Try to identify foods and beverages that make your symptoms worse, and avoid these.   Avoid tight clothing.   Do not exercise right after eating.  SEEK IMMEDIATE MEDICAL CARE IF YOU:  Have severe chest pain that goes down your arm, or into your jaw or neck.   Feel sweaty, dizzy, or lightheaded.   Are short of breath.   Throw up (vomit) blood.   Have difficulty or pain with swallowing.   Have bloody or black, tarry stools.   Have bouts of heartburn more than  three times a week for more than two weeks.  Document Released: 01/03/2009 Document Re-Released: 11/11/2009 [facility] Patient Information 2011 [place], [place].

## 2011-04-06 NOTE — Assessment & Plan Note (Signed)
Tender to palp and he does note some increased symptoms with eating as well. Will have him avoid fatty and spicy foods and proceed with RUQ [place].

## 2011-04-06 NOTE — Assessment & Plan Note (Addendum)
error 

## 2011-04-06 NOTE — Assessment & Plan Note (Signed)
Start Ranitidine 300mg  po daily, avoid offending foods and referred to GI for further evaluation

## 2011-04-06 NOTE — Assessment & Plan Note (Signed)
Very small, nontender, reducible less than a cm and not likely the cause of his current symptoms

## 2011-04-06 NOTE — Progress Notes (Signed)
Ryan Dodson [id number] 26-Jul-1961 04/06/2011      Progress Note-Follow Up  Subjective  Chief Complaint  Chief Complaint  Patient presents with  . Hernia    X 3 days- above belly button    HPI  Is a 50 year old Caucasian male who is in today for evaluation of epigastric and right upper quadrant pain. Initially he coughs when he developed a hernia after working out over the weekend. Last week he was feeling well over the weekend he belches eating to excess but also working out to [place] with heavy lifting and crunches. She has now developed some epigastric and right upper quadrant discomfort. Denies shortness of breath, palpitations, chest pain per se. He does over the weekend he also had increasing loose stools having 4 or 5 loose stools a day as opposed to one. No bloody or tarry stool. Has noted increase epigastric discomfort and dyspepsia over the last 1-2 weeks as well. He watches spicy foods and fatty foods in particular due to his symptoms, eats roughly 10 Rolaids daily. No fevers, chills, congestion, vomiting. He is worried about some mild swelling over his right upper, anterior chest wall. He worries that he has developed an umbilical hernia and does believe he has a hiatal hernia already. He reports he has had an UGI but that was many years ago and he reports he has never had a colonoscopy  Past Medical History  Diagnosis Date  . Allergy     Rhinitis  . 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 Reflux 04/06/2011  . RUQ pain 04/06/2011  . Epigastric pain 04/06/2011  . Hernia, umbilical 04/06/2011    Past Surgical History  Procedure Date  . Vasectomy 2001  . Inguinal hernia repair     on right, mesh in place    Family History  Problem Relation Age of Onset  . Hypotension Mother   . Arthritis Father     History   Social History  . Marital Status: Married    Spouse Name: N/A    Number of Children: N/A  . Years of Education: N/A   Occupational History  . Not on file.   Social  History Main Topics  . Smoking status: Never Smoker   . Smokeless tobacco: Former Neurosurgeon    Quit date: 08/31/1986  . Alcohol Use: 0.0 oz/week    0 drink(s) per week     socially  . Drug Use: No  . Sexually Active: Not on file   Other Topics Concern  . Not on file   Social History Narrative  . No narrative on file    Current Outpatient Prescriptions on File Prior to Visit  Medication Sig Dispense Refill  . multivitamin (THERAGRAN) per tablet Take 1 tablet by mouth daily.        Marland Kitchen NEEDLE, DISP, 22 G (BD DISP NEEDLES) 22G X 1-1/2" MISC 1 dose IM every 2 weeks For testosterone injection  25 each  1  . testosterone cypionate (DEPOTESTOTERONE CYPIONATE) 100 MG/ML injection Inject 1 mL (100 mg total) into the muscle every 14 (fourteen) days. 1 ml given X 8 weeks.  10 mL  0    No Known Allergies  Review of Systems  Review of Systems  Constitutional: Negative for fever and malaise/fatigue.  HENT: Negative for congestion.   Eyes: Negative for discharge.  Respiratory: Negative for shortness of breath.   Cardiovascular: Negative for chest pain, palpitations and leg swelling.  Gastrointestinal: Positive for heartburn, abdominal pain and diarrhea. Negative for vomiting,  constipation, blood in stool and melena.  Genitourinary: Negative for dysuria.  Musculoskeletal: Negative for falls.  Skin: Negative for rash.  Neurological: Negative for loss of consciousness and headaches.  Endo/Heme/Allergies: Negative for polydipsia.  Psychiatric/Behavioral: Negative for depression and suicidal ideas. The patient is not nervous/anxious and does not have insomnia.     Objective  BP 139/87  Pulse 91  Temp(Src) 98.6 F (37 C) (Oral)  Ht 5' 10.5" (1.791 m)  Wt 189 lb 12.8 oz (86.093 kg)  BMI 26.85 kg/m2  SpO2 96%  Physical Exam  Physical Exam  Constitutional: He is oriented to person, place, and time and well-developed, well-nourished, and in no distress. No distress.  HENT:  Head:  Normocephalic and atraumatic.  Eyes: Conjunctivae are normal.  Neck: Neck supple. No thyromegaly present.  Cardiovascular: Normal rate, regular rhythm and normal heart sounds.   No murmur heard. Pulmonary/Chest: Effort normal and breath sounds normal. No respiratory distress.  Abdominal: Bowel sounds are normal. He exhibits no distension. There is tenderness. There is no rebound and no guarding.       Pain with palp over epigastrium and RUQ, hepatic margin noted roughly 1 Fingerbreadth below costal margin, no splenomegaly. With valsava maneuver can feel a small umbilical hernia, less than 1 cm and reducible  Musculoskeletal: He exhibits no edema.  Neurological: He is alert and oriented to person, place, and time.  Skin: Skin is warm.  Psychiatric: Memory, affect and judgment normal.      Assessment & Plan  Epigastric pain Check an H Pylori today, avoid spicy and fatty foods. Patient has some mild point tenderness over the distal sternum but the vast majority of discomfort on palp is soft tissue in the epigastrium and RUQ   RUQ pain Tender to palp and he does note some increased symptoms with eating as well. Will have him avoid fatty and spicy foods and proceed with RUQ [place].   Reflux Start Ranitidine 300mg  po daily, avoid offending foods and referred to GI for further evaluation  Hernia, umbilical Very small, nontender, reducible less than a cm and not likely the cause of his current symptoms

## 2011-04-07 ENCOUNTER — Ambulatory Visit (HOSPITAL_BASED_OUTPATIENT_CLINIC_OR_DEPARTMENT_OTHER)
Admission: RE | Admit: 2011-04-07 | Discharge: 2011-04-07 | Disposition: A | Discharge status: Home / Self Care | Payer: 59 | Source: Ambulatory Visit | Attending: Family Medicine | Admitting: Family Medicine

## 2011-04-07 ENCOUNTER — Encounter: Payer: Self-pay | Admitting: Internal Medicine

## 2011-04-07 DIAGNOSIS — R1011 Right upper quadrant pain: Secondary | ICD-10-CM | POA: Insufficient documentation

## 2011-04-07 DIAGNOSIS — R1013 Epigastric pain: Secondary | ICD-10-CM

## 2011-04-07 DIAGNOSIS — K3189 Other diseases of stomach and duodenum: Secondary | ICD-10-CM | POA: Insufficient documentation

## 2011-04-07 DIAGNOSIS — R142 Eructation: Secondary | ICD-10-CM | POA: Insufficient documentation

## 2011-04-07 DIAGNOSIS — R110 Nausea: Secondary | ICD-10-CM

## 2011-04-07 DIAGNOSIS — R141 Gas pain: Secondary | ICD-10-CM | POA: Insufficient documentation

## 2011-04-07 LAB — HEPATIC FUNCTION PANEL
ALT: 36 U/L (ref 0–53)
AST: 29 U/L (ref 0–37)
Albumin: 4.7 g/dL (ref 3.5–5.2)

## 2011-04-08 ENCOUNTER — Telehealth: Payer: Self-pay

## 2011-04-08 NOTE — Telephone Encounter (Signed)
Patient would like a referral for an Endoscopy?

## 2011-04-08 NOTE — Telephone Encounter (Signed)
Already scheduled

## 2011-04-14 ENCOUNTER — Ambulatory Visit: Payer: 59

## 2011-04-16 ENCOUNTER — Ambulatory Visit (INDEPENDENT_AMBULATORY_CARE_PROVIDER_SITE_OTHER): Payer: 59

## 2011-04-16 DIAGNOSIS — E291 Testicular hypofunction: Secondary | ICD-10-CM

## 2011-04-16 MED ORDER — TESTOSTERONE CYPIONATE 100 MG/ML IM SOLN
100.0000 mg | INTRAMUSCULAR | Status: DC
  Administered 2011-04-16: 100 mg via INTRAMUSCULAR

## 2011-04-16 MED ORDER — TESTOSTERONE CYPIONATE 200 MG/ML IM SOLN: 100.0000 mg | INTRAMUSCULAR | Status: DC

## 2011-04-21 ENCOUNTER — Other Ambulatory Visit: Payer: 59

## 2011-04-27 ENCOUNTER — Telehealth: Payer: Self-pay

## 2011-04-27 DIAGNOSIS — K429 Umbilical hernia without obstruction or gangrene: Secondary | ICD-10-CM

## 2011-04-27 NOTE — Telephone Encounter (Signed)
Patient calling reporting increased periumbilical abdominal pain requesting a referral to surgery for evaluation, will  order

## 2011-04-27 NOTE — Telephone Encounter (Signed)
Referral has been placed. 

## 2011-04-27 NOTE — Telephone Encounter (Signed)
Pt left a message stating he saw the Urologist that was referred for him an dhe has an Umbilical Hernia. Pt states it is hurting really bad and would like md to send a referral to schedule surgery? Please advise?

## 2011-04-30 ENCOUNTER — Encounter: Payer: Self-pay | Admitting: Internal Medicine

## 2011-04-30 ENCOUNTER — Ambulatory Visit (INDEPENDENT_AMBULATORY_CARE_PROVIDER_SITE_OTHER): Payer: 59 | Admitting: Internal Medicine

## 2011-04-30 DIAGNOSIS — R1013 Epigastric pain: Secondary | ICD-10-CM

## 2011-04-30 DIAGNOSIS — K3189 Other diseases of stomach and duodenum: Secondary | ICD-10-CM

## 2011-04-30 DIAGNOSIS — R12 Heartburn: Secondary | ICD-10-CM

## 2011-04-30 NOTE — Patient Instructions (Signed)
You have been given Nexium to take 1 time daily 20-30 minutes before breakfast. We have also sent in a prescription for the Nexium to your pharmacy. You have declined to have a Colonoscopy at this time,  Please call when you are ready to set this up.

## 2011-04-30 NOTE — Progress Notes (Signed)
Subjective:    Patient ID: Ryan Dodson, male    DOB: [birth year], 50 y.o.   MRN: [id number]  HPI Ryan Dodson is a 50 year old male with a past medical history of GERD, and low testosterone who is referred by Dr. Abner Greenspan for evaluation of epigastric abdominal pain and heartburn.  He presents alone today. The patient reports approximately one month of epigastric abdominal pain along with right middle quadrant abdominal pain.  He feels like this is related to "working out" and he states "I think I blew a hernia".  He describes the pain as a "dull" and "driving" pain.  The pain is rather constant but it does worsen with eating. It tends to get better with lying down. He has not had associated nausea and vomiting. He reports he deals with near daily heartburn symptoms. He is using ranitidine 300 mg each bedtime on some nights. This has not been a daily medication for him. He does report occasionally feels like food sticks when he swallows but no trouble with liquids.  No odynophagia.  He reports his bowel movements have been normal for him, formed and brown.  No bright red blood per rectum, or melena.  He does not use NSAIDs.  No fevers chills or night sweats. He reports his appetite has been good and his weight is down about 8 pounds (which he reports is intentional and from calorie restriction).   Review of Systems Constitutional: Negative for fever, chills, night sweats, activity change, appetite change and unexpected weight change HEENT: Negative for sore throat, mouth sores. Eyes: Negative for visual disturbance Respiratory: Negative for cough, chest tightness and shortness of breath Cardiovascular: Negative for chest pain, palpitations and lower extremity swelling Gastrointestinal: See history of present illness Genitourinary: Negative for dysuria and hematuria. Musculoskeletal: Negative for back pain, arthralgias and myalgias Skin: Negative for rash or color change Neurological: Negative for  headaches, weakness, numbness Hematological: Negative for adenopathy, negative for easy bruising/bleeding Psychiatric/behavioral: Negative for depressed mood, negative for anxiety  PMH: GERD Low testosterone Allergic rhinitis  Current outpatient prescriptions:NEEDLE, DISP, 22 G (BD DISP NEEDLES) 22G X 1-1/2" MISC, 1 dose IM every 2 weeks For testosterone injection, Disp: 25 each, Rfl: 1;  ranitidine (ZANTAC) 300 MG tablet, Take 1 tablet (300 mg total) by mouth at bedtime., Disp: 30 tablet, Rfl: 1 testosterone cypionate (DEPOTESTOTERONE CYPIONATE) 100 MG/ML injection, Inject 1 mL (100 mg total) into the muscle every 14 (fourteen) days. 1 ml given X 8 weeks., Disp: 10 mL, Rfl: 0  No Known Allergies  Family History  Problem Relation Age of Onset  . Hypotension Mother   . Arthritis Father   -neg for CRC, colon polyps  Social History  . Marital Status: Married    Spouse Name: N/A    Number of Children: 2   Occupational History  . [employer]    Social History Main Topics  . Smoking status: Never Smoker   . Smokeless tobacco: Former Neurosurgeon    Quit date: 08/31/1986  . Alcohol Use: Socially, <1/day  . Drug Use: No      Objective:   Physical Exam BP 118/72  Pulse 88  Ht 5\' 11"  (1.803 m)  Wt 185 lb (83.915 kg)  BMI 25.80 kg/m2 Constitutional: Well-developed and well-nourished. No distress. HEENT: Normocephalic and atraumatic. Oropharynx is clear and moist. No oropharyngeal exudate. Conjunctivae are normal. Pupils are equal round and reactive to light. No scleral icterus. Neck: Neck supple. Trachea midline. Cardiovascular: Normal rate, regular rhythm and intact distal  pulses. No M/R/G Pulmonary/chest: Effort normal and breath sounds normal. No wheezing, rales or rhonchi. Abdominal: Soft, tender to palp epigastrium and right middle quad, no rebound or guarding.  Non-distended.  ? Small umbilical hernia without incarceration, nondistended. There are no masses palpable. No  hepatosplenomegaly. Lymphadenopathy: No cervical adenopathy noted. Neurological: Alert and oriented to person place and time. Skin: Skin is warm and dry. No rashes noted. Psychiatric: Normal mood and affect. Behavior is normal.  Labs/Imaging: CBC    Component Value Date/Time   WBC 9.2 04/06/2011 1406   RBC 5.20 04/06/2011 1406   HGB 16.4 04/06/2011 1406   HCT 48.6 04/06/2011 1406   PLT 287 04/06/2011 1406   MCV 93.5 04/06/2011 1406   MCH 31.5 04/06/2011 1406   MCHC 33.7 04/06/2011 1406   RDW 12.7 04/06/2011 1406   LYMPHSABS 1.6 04/06/2011 1406   MONOABS 0.7 04/06/2011 1406   EOSABS 0.2 04/06/2011 1406   BASOSABS 0.0 04/06/2011 1406   CMP     Component Value Date/Time   PROT 7.4 04/06/2011 1406   ALBUMIN 4.7 04/06/2011 1406   AST 29 04/06/2011 1406   ALT 36 04/06/2011 1406   ALKPHOS 49 04/06/2011 1406   BILITOT 0.3 04/06/2011 1406   H. Pylori ab NEGATIVE  04/06/11: ABDOMINAL ULTRASOUND COMPLETE  Comparison: None.  Findings:  Gallbladder: No gallstones, gallbladder wall thickening, or  pericholecystic fluid.  Common Bile Duct: Within normal limits in caliber.  Liver: No focal mass lesion identified. Within normal limits in  parenchymal echogenicity.  IVC: Appears normal.  Pancreas: Obscured by bowel gas, limited visualization.  Spleen: Within normal limits in size and echotexture.  Right kidney: Normal in size and parenchymal echogenicity. No  evidence of mass or hydronephrosis.  Left kidney: Normal in size and parenchymal echogenicity. No  evidence of mass or hydronephrosis.  Abdominal Aorta: No aneurysm identified.   IMPRESSION:  Negative abdominal ultrasound. Limited because of obscuring bowel  gas.     Assessment & Plan:  This is a 50 year old male with a past medical history of GERD and low testosterone who presents with epigastric and right sided abdominal pain, along with uncontrolled GERD  1. Abd pain/GERD - the patient's symptoms are most consistent with ulcer-like dyspepsia.  He is  under the impression that this is more likely related to a hernia or abdominal wall injury, however we discussed that PUD can cause his current symptoms.  Given this I will plan EGD for further evaluation. I will start him on PPI with Nexium 40 mg daily. We discussed how best to take this (30 minutes to one hour before his first meal of the day).  This will likely replace the ranitidine, though he could use this when necessary for breakthrough reflux symptoms.   He has been referred to surgery for evaluation of umbilical hernia and this appointment is pending.  At this point my suspicion that this is causing his symptoms his low, but will defer to their expertise.  It EGD is unremarkable will proceed with CT scan of the abdomen.  2. CRC screening - the patient is average risk. We discussed and I have recommended colonoscopy. He reports his PCP and wife want him to do this, but he would like to defer this for now. We'll discuss this again in followup.

## 2011-05-01 ENCOUNTER — Ambulatory Visit: Payer: 59 | Admitting: Internal Medicine

## 2011-05-01 DIAGNOSIS — Z029 Encounter for administrative examinations, unspecified: Secondary | ICD-10-CM

## 2011-05-05 ENCOUNTER — Ambulatory Visit (INDEPENDENT_AMBULATORY_CARE_PROVIDER_SITE_OTHER): Payer: 59 | Admitting: General Surgery

## 2011-05-05 ENCOUNTER — Encounter (INDEPENDENT_AMBULATORY_CARE_PROVIDER_SITE_OTHER): Payer: Self-pay | Admitting: General Surgery

## 2011-05-05 DIAGNOSIS — G8929 Other chronic pain: Secondary | ICD-10-CM | POA: Insufficient documentation

## 2011-05-05 DIAGNOSIS — R1031 Right lower quadrant pain: Secondary | ICD-10-CM

## 2011-05-05 NOTE — Patient Instructions (Signed)
May continue with normal activities Call if bulge worsens or if pain worsens or fever or nausea/vomiting

## 2011-05-05 NOTE — Progress Notes (Signed)
Subjective:     Patient ID: Ryan Dodson, male   DOB: [birth year], 50 y.o.   MRN: [id number]  HPI We are asked to see the patient in consultation by Dr. Reuel Derby to evaluate him for a hernia. The patient is a 50 year old white male who first noted some right-sided abdominal discomfort a couple of months ago after working out heavy squatting at the gym. Since that time he's noticed some intermittent bloating as well as some puffiness of his right abdominal wall. He denies any nausea or vomiting. He does have problems with constipation that has been chronic for him. He denies any fevers or chills. No chest pain or shortness of breath.  Review of Systems  Constitutional: Negative.   HENT: Negative.   Eyes: Negative.   Respiratory: Negative.   Cardiovascular: Negative.   Gastrointestinal: Positive for constipation and abdominal distention.  Genitourinary: Negative.   Musculoskeletal: Negative.   Skin: Negative.   Neurological: Negative.   Hematological: Negative.   Psychiatric/Behavioral: Negative.        Objective:   Physical Exam  Constitutional: He is oriented to person, place, and time. He appears well-developed and well-nourished.  HENT:  Head: Normocephalic and atraumatic.  Eyes: Conjunctivae and EOM are normal. Pupils are equal, round, and reactive to light.  Neck: Normal range of motion. Neck supple.  Cardiovascular: Normal rate, regular rhythm and normal heart sounds.   Pulmonary/Chest: Effort normal and breath sounds normal.  Abdominal: Soft. Bowel sounds are normal. He exhibits no distension. There is tenderness.       Patient has some tenderness lateral to the umbilicus on the right of the abdomen. There is no palpable bulge seen or fascial defect at this location.  Musculoskeletal: Normal range of motion.  Neurological: He is alert and oriented to person, place, and time.  Skin: Skin is warm and dry.  Psychiatric: He has a normal mood and affect. His behavior is  normal.       Assessment:     Right-sided abdominal pain without any physical findings for hernia    Plan:     We will plan to obtain a CT scan of his abdomen and pelvis to look for source of his pain. We will plan to see him back in the next couple weeks to go over the results of the scan.

## 2011-05-06 ENCOUNTER — Ambulatory Visit: Payer: 59

## 2011-05-07 ENCOUNTER — Ambulatory Visit
Admission: RE | Admit: 2011-05-07 | Discharge: 2011-05-07 | Disposition: A | Discharge status: Home / Self Care | Payer: 59 | Source: Ambulatory Visit | Attending: General Surgery | Admitting: General Surgery

## 2011-05-07 ENCOUNTER — Telehealth: Payer: Self-pay | Admitting: Internal Medicine

## 2011-05-07 MED ORDER — IOHEXOL 300 MG/ML  SOLN
100.0000 mL | Freq: Once | INTRAMUSCULAR | Status: AC | PRN
  Administered 2011-05-07: 100 mL via INTRAVENOUS

## 2011-05-07 NOTE — Telephone Encounter (Signed)
I called patient has requested to discuss CT scan. CT scan was ordered by Dr. Carolynne Edouard. Scan was entirely unremarkable. This is reassuring. I informed the patient of the scan results and we will proceed with upper endoscopy as discussed at our clinic visit. We discussed the CT is not perfect for mucosal disease, such as ulcer disease, and thus I still advise proceeding with upper endoscopy. Patient reassured and in agreement with the plan.  Time provided for questions and answers and he thanked me for the call.

## 2011-05-07 NOTE — Telephone Encounter (Signed)
Pt called again and left a number to be called back at- [phone]. Informed pt per the report his CT was negative, but Dr Rhea Belton nay have comments, orders, etc. Pt requested Dr Rhea Belton return his call. Dr Rhea Belton, can you call pt at [phone] to discuss CT scan? Thanks.

## 2011-05-07 NOTE — Telephone Encounter (Signed)
lmom for pt to call back if he likes. The CT exam ended at 1035am per EPIC and the Radiologist hasn't read it.

## 2011-05-08 ENCOUNTER — Ambulatory Visit (INDEPENDENT_AMBULATORY_CARE_PROVIDER_SITE_OTHER): Payer: 59

## 2011-05-08 DIAGNOSIS — E291 Testicular hypofunction: Secondary | ICD-10-CM

## 2011-05-08 MED ORDER — TESTOSTERONE CYPIONATE 100 MG/ML IM SOLN
100.0000 mg | INTRAMUSCULAR | Status: DC
  Administered 2011-05-08: 100 mg via INTRAMUSCULAR

## 2011-05-14 ENCOUNTER — Ambulatory Visit (AMBULATORY_SURGERY_CENTER): Payer: 59 | Admitting: Internal Medicine

## 2011-05-14 ENCOUNTER — Encounter: Payer: Self-pay | Admitting: Internal Medicine

## 2011-05-14 DIAGNOSIS — K2270 Barrett's esophagus without dysplasia: Secondary | ICD-10-CM

## 2011-05-14 DIAGNOSIS — R1031 Right lower quadrant pain: Secondary | ICD-10-CM

## 2011-05-14 DIAGNOSIS — R1013 Epigastric pain: Secondary | ICD-10-CM

## 2011-05-14 HISTORY — PX: ESOPHAGOGASTRODUODENOSCOPY: SHX1529

## 2011-05-14 MED ORDER — SODIUM CHLORIDE 0.9 % IV SOLN: 500.0000 mL | INTRAVENOUS | Status: DC

## 2011-05-14 NOTE — Patient Instructions (Signed)
Please refer to your blue and neon green sheets for instructions regarding diet and activity for the rest of today.  You may resume your medications as you would normally take them.  

## 2011-05-15 ENCOUNTER — Telehealth: Payer: Self-pay | Admitting: *Deleted

## 2011-05-15 NOTE — Telephone Encounter (Signed)

## 2011-05-20 ENCOUNTER — Other Ambulatory Visit: Payer: Self-pay | Admitting: Internal Medicine

## 2011-05-20 ENCOUNTER — Telehealth: Payer: Self-pay | Admitting: Internal Medicine

## 2011-05-20 ENCOUNTER — Encounter: Payer: Self-pay | Admitting: Internal Medicine

## 2011-05-20 MED ORDER — ESOMEPRAZOLE MAGNESIUM 40 MG PO CPDR: 40.0000 mg | DELAYED_RELEASE_CAPSULE | Freq: Every day | ORAL | Status: DC

## 2011-05-20 NOTE — Telephone Encounter (Signed)
lmom for pt to return if needed; ordered his Nexium at [facility].

## 2011-05-21 NOTE — Telephone Encounter (Signed)
Left message on machine to call back regarding Nexium discount cards.  We have cards but they are not good to use with Medicare.

## 2011-05-22 ENCOUNTER — Ambulatory Visit (INDEPENDENT_AMBULATORY_CARE_PROVIDER_SITE_OTHER): Payer: 59

## 2011-05-22 DIAGNOSIS — E291 Testicular hypofunction: Secondary | ICD-10-CM

## 2011-05-22 MED ORDER — TESTOSTERONE CYPIONATE 100 MG/ML IM SOLN
100.0000 mg | INTRAMUSCULAR | Status: DC
  Administered 2011-05-22: 100 mg via INTRAMUSCULAR

## 2011-05-26 NOTE — Telephone Encounter (Signed)
Pt aware and said he has a discount program that he was given by another facility that he is using

## 2011-06-08 ENCOUNTER — Ambulatory Visit (INDEPENDENT_AMBULATORY_CARE_PROVIDER_SITE_OTHER): Payer: 59

## 2011-06-08 DIAGNOSIS — E291 Testicular hypofunction: Secondary | ICD-10-CM

## 2011-06-08 MED ORDER — TESTOSTERONE CYPIONATE 100 MG/ML IM SOLN
100.0000 mg | INTRAMUSCULAR | Status: DC
  Administered 2011-06-08: 100 mg via INTRAMUSCULAR

## 2011-06-23 ENCOUNTER — Other Ambulatory Visit: Payer: Self-pay

## 2011-06-23 MED ORDER — TESTOSTERONE CYPIONATE 100 MG/ML IM SOLN: 100.0000 mg | INTRAMUSCULAR | Status: DC

## 2011-06-23 NOTE — Telephone Encounter (Signed)
OK to refill with 3 rf

## 2012-01-06 ENCOUNTER — Other Ambulatory Visit: Payer: Self-pay | Admitting: Family Medicine

## 2012-01-06 NOTE — Telephone Encounter (Signed)
Patient informed and appt scheduled.

## 2012-01-06 NOTE — Telephone Encounter (Signed)
Needs labs checked before I can refill. He has not come in to have it rechecked since starting the med

## 2012-01-06 NOTE — Telephone Encounter (Signed)
Please advise refill? Last RX wrote on 06-23-11 with 3 refills. Fax to [phone]

## 2012-01-08 ENCOUNTER — Other Ambulatory Visit (INDEPENDENT_AMBULATORY_CARE_PROVIDER_SITE_OTHER): Payer: 59

## 2012-01-08 DIAGNOSIS — R7989 Other specified abnormal findings of blood chemistry: Secondary | ICD-10-CM

## 2012-01-08 DIAGNOSIS — E291 Testicular hypofunction: Secondary | ICD-10-CM

## 2012-01-08 LAB — TESTOSTERONE: Testosterone: 281.8 ng/dL — ABNORMAL LOW (ref 300–890)

## 2012-01-11 ENCOUNTER — Telehealth: Payer: Self-pay

## 2012-01-11 ENCOUNTER — Other Ambulatory Visit: Payer: 59

## 2012-01-11 ENCOUNTER — Other Ambulatory Visit: Payer: Self-pay

## 2012-01-11 MED ORDER — TESTOSTERONE CYPIONATE 100 MG/ML IM SOLN: 100.0000 mg | INTRAMUSCULAR | Status: DC

## 2012-01-11 NOTE — Telephone Encounter (Signed)
Pt left a message wanting to know if MD would let him take 2 ml of his testosterone instead of the 1 ml. I informed patient that that he must take the 1ml of testosterone until he sees Dr Abner Greenspan on Friday. Pt stated that he understood.

## 2012-01-11 NOTE — Telephone Encounter (Signed)
[facility] at fax [phone]

## 2012-01-15 ENCOUNTER — Encounter: Payer: Self-pay | Admitting: Family Medicine

## 2012-01-15 ENCOUNTER — Ambulatory Visit (INDEPENDENT_AMBULATORY_CARE_PROVIDER_SITE_OTHER): Payer: 59 | Admitting: Family Medicine

## 2012-01-15 VITALS — BP 152/81 | HR 91 | Ht 70.5 in | Wt 193.0 lb

## 2012-01-15 DIAGNOSIS — E291 Testicular hypofunction: Secondary | ICD-10-CM

## 2012-01-15 NOTE — Assessment & Plan Note (Signed)
Stable, responds symptomatically to 100 mg dosing but even better to 150mg  dosing q 2 wks. We reviewed the fact that the testosterone blood check needs to be timed 1 week after an injection to get a fairly accurate blood level assessment, so he'll return in the near future to get this blood draw so we can document where the 150mg  q2 wks dosing is putting his testosterone level.  This was ordered as a future order today and he'll make lab appt for this.

## 2012-01-15 NOTE — Progress Notes (Signed)
OFFICE NOTE  01/15/2012  CC:  Chief Complaint  Patient presents with  . Follow-up    discuss testosterone, ? increase dose, last labs 01/08/12     HPI: Patient is a 51 y.o. Caucasian male who is here for routine male hypogonadism follow up. Says "I've never felt better".  Energy, sleep, libido all excellent.   He did 100mg  q2 wk dosing for quite a while and then at some point in the last few months he increased his dose to 150 mg q2wk dosing and felt a bit better than the 100mg  dosing. Says recent testosterone check was done at least 3 weeks after his most recent injection. Taking a vacation seemed to alter his scheduling/timing of testost injection with his injection partner, so he admits to missing a dose and didn't think it would matter much regarding his testosterone level check. No new complaints today.  Pertinent PMH:  Past Medical History  Diagnosis Date  . Allergy     Rhinitis  . 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 Reflux 04/06/2011  . RUQ pain 04/06/2011  . Epigastric pain 04/06/2011  . Hernia, umbilical 04/06/2011    MEDS:  Outpatient Prescriptions Prior to Visit  Medication Sig Dispense Refill  . esomeprazole (NEXIUM) 40 MG capsule Take 1 capsule (40 mg total) by mouth daily.  30 capsule  6  . NEEDLE, DISP, 22 G (BD DISP NEEDLES) 22G X 1-1/2" MISC 1 dose IM every 2 weeks For testosterone injection  25 each  1  . testosterone cypionate (DEPOTESTOTERONE CYPIONATE) 100 MG/ML injection Inject 1 mL (100 mg total) into the muscle every 14 (fourteen) days. 1 ml given X 8 weeks.  10 mL  0  . Esomeprazole Magnesium (NEXIUM PO) Take by mouth daily.          PE: Blood pressure 152/81, pulse 91, height 5' 10.5" (1.791 m), weight 193 lb (87.544 kg). Gen: Alert, well appearing.  Patient is oriented to person, place, time, and situation. CV: RRR, no m/r/g.   LUNGS: CTA bilat, nonlabored resps, good aeration in all lung fields.   IMPRESSION AND PLAN:  Low testosterone Stable, responds  symptomatically to 100 mg dosing but even better to 150mg  dosing q 2 wks. We reviewed the fact that the testosterone blood check needs to be timed 1 week after an injection to get a fairly accurate blood level assessment, so he'll return in the near future to get this blood draw so we can document where the 150mg  q2 wks dosing is putting his testosterone level.  This was ordered as a future order today and he'll make lab appt for this.      FOLLOW UP: prn

## 2012-02-29 ENCOUNTER — Other Ambulatory Visit: Payer: Self-pay

## 2012-02-29 MED ORDER — TESTOSTERONE CYPIONATE 200 MG/ML IM SOLN: 200.0000 mg | INTRAMUSCULAR | Status: DC

## 2012-02-29 NOTE — Telephone Encounter (Signed)
So we can change his prescription to Testosterone injections to 200 mg every 2 weeks, disp #10 ml, 1 refill til seen and then he needs an annual exam with fasting labs and a repeat testosterone check in August and then an annual exam to review

## 2012-02-29 NOTE — Telephone Encounter (Signed)
Pt informed and RX faxed

## 2012-02-29 NOTE — Telephone Encounter (Signed)
Pt left a message on my voicemail stating he needed a refill of his testosterone? Last RX was wrote on 01-11-12 Disp 10 ml with 0 refills. Patient states that when he saw Dr Milinda Cave he was told to go up to 200? Please advise refill and dosing.  Send RX to [facility]

## 2012-05-04 ENCOUNTER — Ambulatory Visit (INDEPENDENT_AMBULATORY_CARE_PROVIDER_SITE_OTHER): Payer: 59 | Admitting: Family Medicine

## 2012-05-04 ENCOUNTER — Encounter: Payer: Self-pay | Admitting: Family Medicine

## 2012-05-04 VITALS — BP 148/91 | HR 80 | Temp 98.1°F | Ht 70.5 in | Wt 194.8 lb

## 2012-05-04 DIAGNOSIS — K2270 Barrett's esophagus without dysplasia: Secondary | ICD-10-CM

## 2012-05-04 DIAGNOSIS — R1011 Right upper quadrant pain: Secondary | ICD-10-CM

## 2012-05-04 DIAGNOSIS — E291 Testicular hypofunction: Secondary | ICD-10-CM

## 2012-05-04 DIAGNOSIS — R5381 Other malaise: Secondary | ICD-10-CM

## 2012-05-04 DIAGNOSIS — K219 Gastro-esophageal reflux disease without esophagitis: Secondary | ICD-10-CM

## 2012-05-04 DIAGNOSIS — R5383 Other fatigue: Secondary | ICD-10-CM

## 2012-05-04 DIAGNOSIS — Z0000 Encounter for general adult medical examination without abnormal findings: Secondary | ICD-10-CM

## 2012-05-04 DIAGNOSIS — E349 Endocrine disorder, unspecified: Secondary | ICD-10-CM

## 2012-05-04 DIAGNOSIS — Z13220 Encounter for screening for lipoid disorders: Secondary | ICD-10-CM

## 2012-05-04 DIAGNOSIS — IMO0001 Reserved for inherently not codable concepts without codable children: Secondary | ICD-10-CM

## 2012-05-04 HISTORY — DX: Barrett's esophagus without dysplasia: K22.70

## 2012-05-04 LAB — RENAL FUNCTION PANEL
Albumin: 4.3 g/dL (ref 3.5–5.2)
BUN: 15 mg/dL (ref 6–23)
GFR: 70.15 mL/min (ref >60.00)
Glucose, Bld: 78 mg/dL (ref 70–99)
Phosphorus: 2.8 mg/dL (ref 2.3–4.6)

## 2012-05-04 LAB — LIPID PANEL
Cholesterol: 195 mg/dL (ref 0–200)
Total CHOL/HDL Ratio: 5
Triglycerides: 201 mg/dL — ABNORMAL HIGH (ref 0.0–149.0)
VLDL: 40.2 mg/dL — ABNORMAL HIGH (ref 0.0–40.0)

## 2012-05-04 LAB — HEPATIC FUNCTION PANEL
Albumin: 4.3 g/dL (ref 3.5–5.2)
Total Protein: 7.5 g/dL (ref 6.0–8.3)

## 2012-05-04 LAB — CBC
Platelets: 283 10*3/uL (ref 150.0–400.0)
RBC: 5.49 Mil/uL (ref 4.22–5.81)
WBC: 8.9 10*3/uL (ref 4.5–10.5)

## 2012-05-04 LAB — TSH: TSH: 1.42 u[IU]/mL (ref 0.35–5.50)

## 2012-05-04 LAB — MAGNESIUM: Magnesium: 2.1 mg/dL (ref 1.5–2.5)

## 2012-05-04 MED ORDER — OMEPRAZOLE 20 MG PO CPDR: 20.0000 mg | DELAYED_RELEASE_CAPSULE | Freq: Every day | ORAL | Status: DC

## 2012-05-04 MED ORDER — TESTOSTERONE CYPIONATE 100 MG/ML IM SOLN: 200.0000 mg | INTRAMUSCULAR | Status: DC

## 2012-05-04 NOTE — Assessment & Plan Note (Signed)
Doing well on Omeprazole 20 mg daily, avoid offending foods

## 2012-05-04 NOTE — Progress Notes (Signed)
Patient ID: Ryan Dodson, male   DOB: 03/03/1961, 51 y.o.   MRN: [id number] Ryan Dodson [id number] 1961/08/09 05/04/2012      Progress Note New Patient  Subjective  Chief Complaint  Chief Complaint  Patient presents with  . Annual Exam    HPI  Patient is a 51 year old Caucasian male who is in today for annual exam. He is pleased with his improved energy level and overall health since starting testosterone. He reports 200 every other week makes him feel better than 100 bid although he did like to 100 per mL better than the 200 per mL preparation. He offers no acute complaints. He is right upper quadrant discomfort and swelling has resolved. He relates it back now to some trauma he suffered in his abdominal wall and says slowly over 6 months it resolved. He is on omeprazole daily now for his heartburn and his Barrett's esophagitis and is not having any symptoms. He does use Rolaids he says occasionally. He is started on magnesium supplement as well. He denies any recent illness, vision or hearing changes, chest pain, palpitations, shortness of breath, GI or GU complaints at today's visit. He did undergo an upper endoscopy which did show hiatal hernia , Barrett's esophagitis and distal esophageal diverticulum but he is asymptomatic at this time.  Past Medical History  Diagnosis Date  . Allergy     Rhinitis  . 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 Reflux 04/06/2011  . RUQ pain 04/06/2011  . Epigastric pain 04/06/2011  . Hernia, umbilical 04/06/2011  . Preventative health care 05/04/2012  . Barrett esophagus 05/04/2012    Past Surgical History  Procedure Date  . Vasectomy 2001  . Inguinal hernia repair     on right, mesh in place    Family History  Problem Relation Age of Onset  . Hypotension Mother   . Arthritis Father     History   Social History  . Marital Status: Married    Spouse Name: N/A    Number of Children: 2  . Years of Education: N/A   Occupational History  . [employer]     Social History Main Topics  . Smoking status: Never Smoker   . Smokeless tobacco: Former Neurosurgeon    Quit date: 08/31/1986  . Alcohol Use: No     socially  . Drug Use: No  . Sexually Active: Yes   Other Topics Concern  . Not on file   Social History Narrative  . No narrative on file    Current Outpatient Prescriptions on File Prior to Visit  Medication Sig Dispense Refill  . NEEDLE, DISP, 22 G (BD DISP NEEDLES) 22G X 1-1/2" MISC 1 dose IM every 2 weeks For testosterone injection  25 each  1  . omeprazole (PRILOSEC) 20 MG capsule Take 1 capsule (20 mg total) by mouth daily.  30 capsule  3    No Known Allergies  Review of Systems  Review of Systems  Constitutional: Negative for fever, chills and malaise/fatigue.  HENT: Negative for hearing loss, nosebleeds and congestion.   Eyes: Negative for discharge.  Respiratory: Negative for cough, sputum production, shortness of breath and wheezing.   Cardiovascular: Negative for chest pain, palpitations and leg swelling.  Gastrointestinal: Negative for heartburn, nausea, vomiting, abdominal pain, diarrhea, constipation and blood in stool.  Genitourinary: Negative for dysuria, urgency, frequency and hematuria.  Musculoskeletal: Negative for myalgias, back pain and falls.  Skin: Negative for rash.  Neurological: Negative for dizziness, tremors, sensory change,  focal weakness, loss of consciousness, weakness and headaches.  Endo/Heme/Allergies: Negative for polydipsia. Does not bruise/bleed easily.  Psychiatric/Behavioral: Negative for depression and suicidal ideas. The patient is not nervous/anxious and does not have insomnia.     Objective  BP 148/91  Pulse 80  Temp 98.1 F (36.7 C) (Temporal)  Ht 5' 10.5" (1.791 m)  Wt 194 lb 12.8 oz (88.361 kg)  BMI 27.56 kg/m2  SpO2 98%  Physical Exam  Physical Exam  Constitutional: He is oriented to person, place, and time and well-developed, well-nourished, and in no distress. No  distress.  HENT:  Head: Normocephalic and atraumatic.  Right Ear: External ear normal.  Left Ear: External ear normal.  Nose: Nose normal.  Mouth/Throat: Oropharynx is clear and moist. No oropharyngeal exudate.  Eyes: Conjunctivae are normal. Pupils are equal, round, and reactive to light. Right eye exhibits no discharge. Left eye exhibits no discharge. No scleral icterus.  Neck: Neck supple. No thyromegaly present.  Cardiovascular: Normal rate, regular rhythm, normal heart sounds and intact distal pulses.   No murmur heard. Pulmonary/Chest: Effort normal and breath sounds normal. No respiratory distress. He has no wheezes.  Abdominal: He exhibits no distension and no mass. There is no tenderness. There is no rebound and no guarding.  Musculoskeletal: Normal range of motion. He exhibits no edema.  Lymphadenopathy:    He has no cervical adenopathy.  Neurological: He is alert and oriented to person, place, and time. He has normal reflexes. No cranial nerve deficit. Gait normal. Coordination normal.  Skin: Skin is warm and dry.  Psychiatric: Memory, affect and judgment normal.       Assessment & Plan  Testosterone deficiency Patient feeling much better since his testosterone was increased. He feels the 100/ml preparation works better for him than the 200/ml. We will change the rx and recheck his level today and again in 6 months.  RUQ pain Resolved but took several months, he had a trauma and strained the abdominal wall it is now resolved  Reflux Doing well on Omeprazole 20 mg daily, avoid offending foods   FATIGUE Improved with testosterone and increased exercise, sleeping well.   Preventative health care Offered screening colonoscopy today. Patient chooses to wait until 2014 when his insurance changes but then he agrees to proceed, he travels internationally frequently and asks about hepatitis, is offered the hepatitis B series, which he will think about. We will check an acute  hepatitis panel today. Declines flu shot  Barrett esophagus Doing well on Omeprazole, is taking a calcium supplement and magnesium intermittently to combat poor absorption, will check calcium and magnesium levels today. He is encouraged to try Citracal

## 2012-05-04 NOTE — Assessment & Plan Note (Signed)
Doing well on Omeprazole, is taking a calcium supplement and magnesium intermittently to combat poor absorption, will check calcium and magnesium levels today. He is encouraged to try Citracal

## 2012-05-04 NOTE — Assessment & Plan Note (Signed)
Resolved but took several months, he had a trauma and strained the abdominal wall it is now resolved

## 2012-05-04 NOTE — Assessment & Plan Note (Signed)
Improved with testosterone and increased exercise, sleeping well.

## 2012-05-04 NOTE — Assessment & Plan Note (Addendum)
Offered screening colonoscopy today. Patient chooses to wait until 2014 when his insurance changes but then he agrees to proceed, he travels internationally frequently and asks about hepatitis, is offered the hepatitis B series, which he will think about. We will check an acute hepatitis panel today. Declines flu shot

## 2012-05-04 NOTE — Patient Instructions (Addendum)
Preventive Care for Adults, Male A healthy lifestyle and preventative care can promote health and wellness. Preventative health guidelines for men include the following key practices:  A routine yearly physical is a good way to check with your caregiver about your health and preventative screening. It is a chance to share any concerns and updates on your health, and to receive a thorough exam.   Visit your dentist for a routine exam and preventative care every 6 months. Brush your teeth twice a day and floss once a day. Good oral hygiene prevents tooth decay and gum disease.   The frequency of eye exams is based on your age, health, family medical history, use of contact lenses, and other factors. Follow your caregiver's recommendations for frequency of eye exams.   Eat a healthy diet. Foods like vegetables, fruits, whole grains, low-fat dairy products, and lean protein foods contain the nutrients you need without too many calories. Decrease your intake of foods high in solid fats, added sugars, and salt. Eat the right amount of calories for you.Get information about a proper diet from your caregiver, if necessary.   Regular physical exercise is one of the most important things you can do for your health. Most adults should get at least 150 minutes of moderate-intensity exercise (any activity that increases your heart rate and causes you to sweat) each week. In addition, most adults need muscle-strengthening exercises on 2 or more days a week.   Maintain a healthy weight. The body mass index (BMI) is a screening tool to identify possible weight problems. It provides an estimate of body fat based on height and weight. Your caregiver can help determine your BMI, and can help you achieve or maintain a healthy weight.For adults 20 years and older:   A BMI below 18.5 is considered underweight.   A BMI of 18.5 to 24.9 is normal.   A BMI of 25 to 29.9 is considered overweight.   A BMI of 30 and above  is considered obese.   Maintain normal blood lipids and cholesterol levels by exercising and minimizing your intake of saturated fat. Eat a balanced diet with plenty of fruit and vegetables. Blood tests for lipids and cholesterol should begin at age 20 and be repeated every 5 years. If your lipid or cholesterol levels are high, you are over 50, or you are a high risk for heart disease, you may need your cholesterol levels checked more frequently.Ongoing high lipid and cholesterol levels should be treated with medicines if diet and exercise are not effective.   If you smoke, find out from your caregiver how to quit. If you do not use tobacco, do not start.   If you choose to drink alcohol, do not exceed 2 drinks per day. One drink is considered to be 12 ounces (355 mL) of beer, 5 ounces (148 mL) of wine, or 1.5 ounces (44 mL) of liquor.   Avoid use of street drugs. Do not share needles with anyone. Ask for help if you need support or instructions about stopping the use of drugs.   High blood pressure causes heart disease and increases the risk of stroke. Your blood pressure should be checked at least every 1 to 2 years. Ongoing high blood pressure should be treated with medicines, if weight loss and exercise are not effective.   If you are 45 to 52 years old, ask your caregiver if you should take aspirin to prevent heart disease.   Diabetes screening involves taking a blood   sample to check your fasting blood sugar level. This should be done once every 3 years, after age 45, if you are within normal weight and without risk factors for diabetes. Testing should be considered at a younger age or be carried out more frequently if you are overweight and have at least 1 risk factor for diabetes.   Colorectal cancer can be detected and often prevented. Most routine colorectal cancer screening begins at the age of 50 and continues through age 75. However, your caregiver may recommend screening at an earlier  age if you have risk factors for colon cancer. On a yearly basis, your caregiver may provide home test kits to check for hidden blood in the stool. Use of a small camera at the end of a tube, to directly examine the colon (sigmoidoscopy or colonoscopy), can detect the earliest forms of colorectal cancer. Talk to your caregiver about this at age 50, when routine screening begins. Direct examination of the colon should be repeated every 5 to 10 years through age 75, unless early forms of pre-cancerous polyps or small growths are found.   Hepatitis C blood testing is recommended for all people born from 1945 through 1965 and any individual with known risks for hepatitis C.   Practice safe sex. Use condoms and avoid high-risk sexual practices to reduce the spread of sexually transmitted infections (STIs). STIs include gonorrhea, chlamydia, syphilis, trichomonas, herpes, HPV, and human immunodeficiency virus (HIV). Herpes, HIV, and HPV are viral illnesses that have no cure. They can result in disability, cancer, and death.   A one-time screening for abdominal aortic aneurysm (AAA) and surgical repair of large AAAs by sound wave imaging (ultrasonography) is recommended for ages 65 to 75 years who are current or former smokers.   Healthy men should no longer receive prostate-specific antigen (PSA) blood tests as part of routine cancer screening. Consult with your caregiver about prostate cancer screening.   Testicular cancer screening is not recommended for adult males who have no symptoms. Screening includes self-exam, caregiver exam, and other screening tests. Consult with your caregiver about any symptoms you have or any concerns you have about testicular cancer.   Use sunscreen with skin protection factor (SPF) of 30 or more. Apply sunscreen liberally and repeatedly throughout the day. You should seek shade when your shadow is shorter than you. Protect yourself by wearing long sleeves, pants, a  wide-brimmed hat, and sunglasses year round, whenever you are outdoors.   Once a month, do a whole body skin exam, using a mirror to look at the skin on your back. Notify your caregiver of new moles, moles that have irregular borders, moles that are larger than a pencil eraser, or moles that have changed in shape or color.   Stay current with required immunizations.   Influenza. You need a dose every fall (or winter). The composition of the flu vaccine changes each year, so being vaccinated once is not enough.   Pneumococcal polysaccharide. You need 1 to 2 doses if you smoke cigarettes or if you have certain chronic medical conditions. You need 1 dose at age 65 (or older) if you have never been vaccinated.   Tetanus, diphtheria, pertussis (Tdap, Td). Get 1 dose of Tdap vaccine if you are younger than age 65 years, are over 65 and have contact with an infant, are a healthcare worker, or simply want to be protected from whooping cough. After that, you need a Td booster dose every 10 years. Consult your caregiver if   you have not had at least 3 tetanus and diphtheria-containing shots sometime in your life or have a deep or dirty wound.   HPV. This vaccine is recommended for males 13 through 52 years of age. This vaccine may be given to men 22 through 52 years of age who have not completed the 3 dose series. It is recommended for men through age 26 who have sex with men or whose immune system is weakened because of HIV infection, other illness, or medications. The vaccine is given in 3 doses over 6 months.   Measles, mumps, rubella (MMR). You need at least 1 dose of MMR if you were born in 1957 or later. You may also need a 2nd dose.   Meningococcal. If you are age 19 to 21 years and a first-year college student living in a residence hall, or have one of several medical conditions, you need to get vaccinated against meningococcal disease. You may also need additional booster doses.   Zoster (shingles).  If you are age 60 years or older, you should get this vaccine.   Varicella (chickenpox). If you have never had chickenpox or you were vaccinated but received only 1 dose, talk to your caregiver to find out if you need this vaccine.   Hepatitis A. You need this vaccine if you have a specific risk factor for hepatitis A virus infection, or you simply wish to be protected from this disease. The vaccine is usually given as 2 doses, 6 to 18 months apart.   Hepatitis B. You need this vaccine if you have a specific risk factor for hepatitis B virus infection or you simply wish to be protected from this disease. The vaccine is given in 3 doses, usually over 6 months.  Preventative Service / Frequency Ages 19 to 39  Blood pressure check.** / Every 1 to 2 years.   Lipid and cholesterol check.** / Every 5 years beginning at age 20.   Hepatitis C blood test.** / For any individual with known risks for hepatitis C.   Skin self-exam. / Monthly.   Influenza immunization.** / Every year.   Pneumococcal polysaccharide immunization.** / 1 to 2 doses if you smoke cigarettes or if you have certain chronic medical conditions.   Tetanus, diphtheria, pertussis (Tdap,Td) immunization. / A one-time dose of Tdap vaccine. After that, you need a Td booster dose every 10 years.   HPV immunization. / 3 doses over 6 months, if 26 and younger.   Measles, mumps, rubella (MMR) immunization. / You need at least 1 dose of MMR if you were born in 1957 or later. You may also need a 2nd dose.   Meningococcal immunization. / 1 dose if you are age 19 to 21 years and a first-year college student living in a residence hall, or have one of several medical conditions, you need to get vaccinated against meningococcal disease. You may also need additional booster doses.   Varicella immunization.** / Consult your caregiver.   Hepatitis A immunization.** / Consult your caregiver. 2 doses, 6 to 18 months apart.   Hepatitis B  immunization.** / Consult your caregiver. 3 doses usually over 6 months.  Ages 40 to 64  Blood pressure check.** / Every 1 to 2 years.   Lipid and cholesterol check.** / Every 5 years beginning at age 20.   Fecal occult blood test (FOBT) of stool. / Every year beginning at age 50 and continuing until age 75. You may not have to do this test if   you get colonoscopy every 10 years.   Flexible sigmoidoscopy** or colonoscopy.** / Every 5 years for a flexible sigmoidoscopy or every 10 years for a colonoscopy beginning at age 73 and continuing until age 78.   Hepatitis C blood test.** / For all people born from [birth year] through 1965 and any individual with known risks for hepatitis C.   Skin self-exam. / Monthly.   Influenza immunization.** / Every year.   Pneumococcal polysaccharide immunization.** / 1 to 2 doses if you smoke cigarettes or if you have certain chronic medical conditions.   Tetanus, diphtheria, pertussis (Tdap/Td) immunization.** / A one-time dose of Tdap vaccine. After that, you need a Td booster dose every 10 years.   Measles, mumps, rubella (MMR) immunization. / You need at least 1 dose of MMR if you were born in 1957 or later. You may also need a 2nd dose.   Varicella immunization.**/ Consult your caregiver.   Meningococcal immunization.** / Consult your caregiver.   Hepatitis A immunization.** / Consult your caregiver. 2 doses, 6 to 18 months apart.   Hepatitis B immunization.** / Consult your caregiver. 3 doses, usually over 6 months.  Ages 74 and over  Blood pressure check.** / Every 1 to 2 years.   Lipid and cholesterol check.**/ Every 5 years beginning at age 20.   Fecal occult blood test (FOBT) of stool. / Every year beginning at age 63 and continuing until age 13. You may not have to do this test if you get colonoscopy every 10 years.   Flexible sigmoidoscopy** or colonoscopy.** / Every 5 years for a flexible sigmoidoscopy or every 10 years for a colonoscopy  beginning at age 70 and continuing until age 69.   Hepatitis C blood test.** / For all people born from 60 through 1965 and any individual with known risks for hepatitis C.   Abdominal aortic aneurysm (AAA) screening.** / A one-time screening for ages 18 to 62 years who are current or former smokers.   Skin self-exam. / Monthly.   Influenza immunization.** / Every year.   Pneumococcal polysaccharide immunization.** / 1 dose at age 91 (or older) if you have never been vaccinated.   Tetanus, diphtheria, pertussis (Tdap, Td) immunization. / A one-time dose of Tdap vaccine if you are over 65 and have contact with an infant, are a Research scientist (physical sciences), or simply want to be protected from whooping cough. After that, you need a Td booster dose every 10 years.   Varicella immunization. ** / Consult your caregiver.   Meningococcal immunization.** / Consult your caregiver.   Hepatitis A immunization. ** / Consult your caregiver. 2 doses, 6 to 18 months apart.   Hepatitis B immunization.** / Check with your caregiver. 3 doses, usually over 6 months.  **Family history and personal history of risk and conditions may change your caregiver's recommendations. Document Released: 10/13/2001 Document Revised: 08/06/2011 Document Reviewed: 01/12/2011 [facility] Patient Information 2012 [place], [place].  Consider a krill oil caps, MegaRed cap 1 cap daily, krill oil by [employer]

## 2012-05-04 NOTE — Assessment & Plan Note (Signed)
Patient feeling much better since his testosterone was increased. He feels the 100/ml preparation works better for him than the 200/ml. We will change the rx and recheck his level today and again in 6 months.

## 2012-05-05 LAB — HEPATITIS PANEL, ACUTE
HCV Ab: NEGATIVE
Hep A IgM: NEGATIVE
Hep B C IgM: NEGATIVE
Hepatitis B Surface Ag: NEGATIVE

## 2012-05-05 NOTE — Progress Notes (Signed)
Quick Note:  Patient Informed and voiced understanding ______ 

## 2012-05-11 ENCOUNTER — Other Ambulatory Visit: Payer: Self-pay

## 2012-05-11 MED ORDER — "SYRINGE/NEEDLE (DISP) 22G X 1-1/2"" 3 ML MISC": Status: DC

## 2012-05-18 ENCOUNTER — Telehealth: Payer: Self-pay | Admitting: Family Medicine

## 2012-05-18 ENCOUNTER — Other Ambulatory Visit: Payer: Self-pay | Admitting: Family Medicine

## 2012-05-18 DIAGNOSIS — M25512 Pain in left shoulder: Secondary | ICD-10-CM

## 2012-05-18 NOTE — Telephone Encounter (Signed)
Patient has had a weight lifting injury in his left shoulder, he is requesting an ortho referral. Please advise.

## 2012-05-18 NOTE — Telephone Encounter (Signed)
Order is placed.

## 2012-05-19 NOTE — Telephone Encounter (Signed)
Patient is requesting to be seen by Dr Yevette Edwards at [facility], will fax referral

## 2012-05-19 NOTE — Telephone Encounter (Signed)
LM for patient to CB, [facility]ul[facility] Pont & [facility] Mood & also [facility] do not accept his insurance.

## 2012-05-24 ENCOUNTER — Encounter: Payer: Self-pay | Admitting: Gastroenterology

## 2012-05-24 NOTE — Telephone Encounter (Signed)
Noted  

## 2012-05-24 NOTE — Telephone Encounter (Signed)
Patient has gotten very busy at work so he has decided he wants to wait until December to see ortho doctor, this has been a chronic issue. Canceling referral per patient, he will contact our office when he is ready to see an orthopedist for his shoulder.

## 2012-05-26 ENCOUNTER — Encounter: Payer: Self-pay | Admitting: Internal Medicine

## 2012-08-29 ENCOUNTER — Other Ambulatory Visit: Payer: Self-pay

## 2012-08-29 DIAGNOSIS — E349 Endocrine disorder, unspecified: Secondary | ICD-10-CM

## 2012-08-29 NOTE — Telephone Encounter (Signed)
Pt states he would like to go back to 100 mg on his testosterone. Pt states he feels better at the 100 mg instead of the 200 mg. Please advise and send in refill? Pt informed that MD is ut of the office until 09-01-12 and voiced understanding.

## 2012-08-31 NOTE — Telephone Encounter (Signed)
OK to decrease his testosterone to 100 mg each dose, please send in a bottle with 1 refill

## 2012-09-01 MED ORDER — TESTOSTERONE CYPIONATE 100 MG/ML IM SOLN: 100.0000 mg | INTRAMUSCULAR | Status: DC

## 2012-09-01 NOTE — Telephone Encounter (Signed)
Pt informed and RX sent

## 2012-11-20 ENCOUNTER — Other Ambulatory Visit: Payer: Self-pay | Admitting: Family Medicine

## 2012-11-21 ENCOUNTER — Telehealth: Payer: Self-pay | Admitting: Family Medicine

## 2012-11-21 NOTE — Telephone Encounter (Signed)
Please advise Testosterone refill? Last RX was wrote on 09-01-12 quantity 10 ml x 1 refill  If ok fax to [phone]

## 2012-11-21 NOTE — Telephone Encounter (Signed)
Too soon? Maybe he does not realize he has a refill? I will decline for now

## 2012-11-24 NOTE — Telephone Encounter (Signed)
Spoke with pt and informed him that I spoke with [facility] in [place] and they have his RX there

## 2012-11-24 NOTE — Telephone Encounter (Signed)
Patient needs testosterone refill  °

## 2012-12-14 ENCOUNTER — Encounter: Payer: Self-pay | Admitting: Internal Medicine

## 2013-02-16 ENCOUNTER — Other Ambulatory Visit (INDEPENDENT_AMBULATORY_CARE_PROVIDER_SITE_OTHER): Payer: 59

## 2013-02-16 DIAGNOSIS — E291 Testicular hypofunction: Secondary | ICD-10-CM

## 2013-02-16 LAB — TESTOSTERONE: Testosterone: 743 ng/dL (ref 300–890)

## 2013-02-16 NOTE — Progress Notes (Signed)
Labs only

## 2013-04-07 ENCOUNTER — Ambulatory Visit (INDEPENDENT_AMBULATORY_CARE_PROVIDER_SITE_OTHER): Payer: 59 | Admitting: Family Medicine

## 2013-04-07 ENCOUNTER — Encounter: Payer: Self-pay | Admitting: Family Medicine

## 2013-04-07 VITALS — BP 160/77 | HR 86 | Temp 98.8°F | Resp 16 | Ht 70.5 in | Wt 187.0 lb

## 2013-04-07 DIAGNOSIS — H6982 Other specified disorders of Eustachian tube, left ear: Secondary | ICD-10-CM

## 2013-04-07 DIAGNOSIS — J309 Allergic rhinitis, unspecified: Secondary | ICD-10-CM

## 2013-04-07 DIAGNOSIS — H6980 Other specified disorders of Eustachian tube, unspecified ear: Secondary | ICD-10-CM | POA: Insufficient documentation

## 2013-04-07 MED ORDER — PREDNISONE 20 MG PO TABS: ORAL_TABLET | ORAL | Status: DC

## 2013-04-07 MED ORDER — FLUTICASONE PROPIONATE 50 MCG/ACT NA SUSP: NASAL | Status: DC

## 2013-04-07 NOTE — Progress Notes (Signed)
OFFICE NOTE  04/07/2013  CC:  Chief Complaint  Patient presents with  . Ear Problem    left ear fluid.  since June     HPI: Patient is a 52 y.o. Caucasian male who is here for ear problem.   Says all summer (2 mo) he has felt left ear fullness and decreased hearing. He has had pain in the ear, prompted minute clinic visit--azithromycin rx'd and he got better for 1 mo or so. Now he's had about 1 wk of same sx's and feels exhausted and run down.   Says he has terrible allergies but has only left nasal congestion chronically and no facial pain or PND.   No fevers or cough.  Pertinent PMH:  Past Medical History  Diagnosis Date  . Allergy     Rhinitis  . 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 Reflux 04/06/2011  . RUQ pain 04/06/2011  . Epigastric pain 04/06/2011  . Hernia, umbilical 04/06/2011  . Preventative health care 05/04/2012  . Barrett esophagus 05/04/2012   Past Surgical History  Procedure Laterality Date  . Vasectomy  2001  . Inguinal hernia repair      on right, mesh in place    MEDS:  Outpatient Prescriptions Prior to Visit  Medication Sig Dispense Refill  . omeprazole (PRILOSEC) 20 MG capsule Take 1 capsule (20 mg total) by mouth daily.  30 capsule  3  . SYRINGE-NEEDLE, DISP, 3 ML (B-D INTEGRA SYRINGE) 22G X 1-1/2" 3 ML MISC 1 dose IM every 2 weeks for testosterone injection  50 each  1  . testosterone cypionate (DEPOTESTOTERONE CYPIONATE) 100 MG/ML injection Inject 1 mL (100 mg total) into the muscle every 14 (fourteen) days. 1 ml given X 8 weeks.  10 mL  1   No facility-administered medications prior to visit.    PE: Blood pressure 160/77, pulse 86, temperature 98.8 F (37.1 C), temperature source Temporal, resp. rate 16, height 5' 10.5" (1.791 m), weight 187 lb (84.823 kg), SpO2 98.00%. Gen: Alert, well appearing.  Patient is oriented to person, place, time, and situation. ENT: Ears: EACs clear, normal epithelium.  TMs both without erythema and both middle ears clear of  fluid.  Left TM bulges some diffusely and right TM is retracted.  Eyes: no injection, icteris, swelling, or exudate.  EOMI, PERRLA. Nose: mild diffuse injection and mild turbinate edema.  No purulent drainage.  Mouth: lips without lesion/swelling.  Oral mucosa pink and moist.  Dentition intact and without obvious caries or gingival swelling.  Oropharynx without erythema, exudate, or swelling.  Neck - No masses or thyromegaly or limitation in range of motion CV: RRR, no m/r/g.   LUNGS: CTA bilat, nonlabored resps, good aeration in all lung fields.  LAB: none  IMPRESSION AND PLAN:  Chronic allergic rhinitis with eustacian tube dysfunction. Reassured pt. Prednisone 40mg  qd x 5d.  Restart flonase 2 sprays each nostril qd--new rx given.  An After Visit Summary was printed and given to the patient.  FOLLOW UP: prn

## 2013-05-17 ENCOUNTER — Other Ambulatory Visit: Payer: Self-pay | Admitting: Family Medicine

## 2013-05-17 NOTE — Telephone Encounter (Signed)
Patient is requesting a new prescription of testosterone 200mg  [facility] [place].

## 2013-05-18 MED ORDER — TESTOSTERONE CYPIONATE 200 MG/ML IM SOLN: 200.0000 mg | INTRAMUSCULAR | Status: DC

## 2013-05-18 NOTE — Telephone Encounter (Signed)
So I agree he was on the 200mg  every other week with good results in June 2014 so I am OK with that the problem is we have not seen him for a check up since early September 2013, so he can have one more rx for Testosterone, but no exception, he has to be seen before he gets more

## 2013-05-18 NOTE — Telephone Encounter (Signed)
RX printed and pt informed 

## 2013-05-18 NOTE — Telephone Encounter (Signed)
Left a message for patient to return my call? It shows that pt saw Dr Milinda Cave on 04-07-13 and he switched providers at this time?  Need to know if patient did switch if so I will forward this to him.  Patient called back and stated that he wanted to continue seeing Dr Abner Greenspan. Pt also stated that his testosterone is supposed to be 200 mg q w weeks?   Please advise refill? Last RX was done on 09-01-12 quantity 10 ml with 1 refill  If ok fax to [phone]

## 2013-07-12 ENCOUNTER — Telehealth: Payer: Self-pay | Admitting: *Deleted

## 2013-07-12 DIAGNOSIS — Z418 Encounter for other procedures for purposes other than remedying health state: Secondary | ICD-10-CM

## 2013-07-12 DIAGNOSIS — R5381 Other malaise: Secondary | ICD-10-CM

## 2013-07-12 DIAGNOSIS — E291 Testicular hypofunction: Secondary | ICD-10-CM

## 2013-07-12 DIAGNOSIS — Z0000 Encounter for general adult medical examination without abnormal findings: Secondary | ICD-10-CM

## 2013-07-12 DIAGNOSIS — Z125 Encounter for screening for malignant neoplasm of prostate: Secondary | ICD-10-CM

## 2013-07-12 DIAGNOSIS — Z79899 Other long term (current) drug therapy: Secondary | ICD-10-CM

## 2013-07-12 NOTE — Telephone Encounter (Signed)
Pt left message that he needs to have labs drawn and has some questions.

## 2013-07-13 NOTE — Telephone Encounter (Signed)
Needs annual exam with labs prior lipid, renal, cbc, tsh, testosterone, hepatic, psa

## 2013-07-13 NOTE — Telephone Encounter (Signed)
Please call patient to schedule Annual Exam and inform that he needs to have fasting labs done prior, orders entered/SLS Thanks.

## 2013-07-13 NOTE — Telephone Encounter (Signed)
I do not see any need for pt to have labs drawn, could you please advise on this matter. Thanks/SLS

## 2013-07-14 NOTE — Telephone Encounter (Signed)
Informed patient that he needs to schedule a cpe with fasting labs prior. He states that he is driving and will have to call back to schedule this appointment

## 2013-09-11 ENCOUNTER — Telehealth: Payer: Self-pay | Admitting: Family Medicine

## 2013-09-11 MED ORDER — "SYRINGE/NEEDLE (DISP) 22G X 1-1/2"" 3 ML MISC": Status: DC

## 2013-09-11 NOTE — Telephone Encounter (Signed)
refill- bd 3ml syringe with needle.

## 2013-10-23 ENCOUNTER — Telehealth: Payer: Self-pay | Admitting: Family Medicine

## 2013-10-23 NOTE — Telephone Encounter (Signed)
Left a detailed message for patient to return my call. We have not seen patient since 05-04-12. Pt needs an appt for refills

## 2013-10-23 NOTE — Telephone Encounter (Signed)
Refill- bd 3ml syringe with needle

## 2013-10-27 NOTE — Telephone Encounter (Signed)
Please call pt to arrange appt. 

## 2013-10-27 NOTE — Telephone Encounter (Signed)
Appointment has already been scheduled for 11/03/13

## 2013-11-03 ENCOUNTER — Other Ambulatory Visit: Payer: Self-pay | Admitting: Family Medicine

## 2013-11-03 ENCOUNTER — Ambulatory Visit (INDEPENDENT_AMBULATORY_CARE_PROVIDER_SITE_OTHER): Payer: 59 | Admitting: Family Medicine

## 2013-11-03 ENCOUNTER — Telehealth: Payer: Self-pay | Admitting: Family Medicine

## 2013-11-03 ENCOUNTER — Encounter: Payer: Self-pay | Admitting: Family Medicine

## 2013-11-03 VITALS — BP 132/90 | HR 77 | Temp 98.1°F | Ht 70.5 in | Wt 192.0 lb

## 2013-11-03 DIAGNOSIS — K429 Umbilical hernia without obstruction or gangrene: Secondary | ICD-10-CM

## 2013-11-03 DIAGNOSIS — J309 Allergic rhinitis, unspecified: Secondary | ICD-10-CM

## 2013-11-03 DIAGNOSIS — Z862 Personal history of diseases of the blood and blood-forming organs and certain disorders involving the immune mechanism: Secondary | ICD-10-CM

## 2013-11-03 DIAGNOSIS — Z0000 Encounter for general adult medical examination without abnormal findings: Secondary | ICD-10-CM

## 2013-11-03 DIAGNOSIS — E291 Testicular hypofunction: Secondary | ICD-10-CM

## 2013-11-03 DIAGNOSIS — K449 Diaphragmatic hernia without obstruction or gangrene: Secondary | ICD-10-CM

## 2013-11-03 DIAGNOSIS — K219 Gastro-esophageal reflux disease without esophagitis: Secondary | ICD-10-CM

## 2013-11-03 DIAGNOSIS — Z8639 Personal history of other endocrine, nutritional and metabolic disease: Secondary | ICD-10-CM

## 2013-11-03 DIAGNOSIS — E785 Hyperlipidemia, unspecified: Secondary | ICD-10-CM

## 2013-11-03 DIAGNOSIS — E349 Endocrine disorder, unspecified: Secondary | ICD-10-CM

## 2013-11-03 LAB — RENAL FUNCTION PANEL
ALBUMIN: 4.5 g/dL (ref 3.5–5.2)
BUN: 22 mg/dL (ref 6–23)
CALCIUM: 9.7 mg/dL (ref 8.4–10.5)
CO2: 27 mEq/L (ref 19–32)
Chloride: 99 mEq/L (ref 96–112)
Creat: 1.25 mg/dL (ref 0.50–1.35)
Glucose, Bld: 72 mg/dL (ref 70–99)
Phosphorus: 4.3 mg/dL (ref 2.3–4.6)
Potassium: 4.4 mEq/L (ref 3.5–5.3)
SODIUM: 135 meq/L (ref 135–145)

## 2013-11-03 LAB — HEPATIC FUNCTION PANEL
ALBUMIN: 4.5 g/dL (ref 3.5–5.2)
ALT: 35 U/L (ref 0–53)
AST: 34 U/L (ref 0–37)
Alkaline Phosphatase: 49 U/L (ref 39–117)
Bilirubin, Direct: 0.1 mg/dL (ref 0.0–0.3)
Indirect Bilirubin: 0.3 mg/dL (ref 0.2–1.2)
TOTAL PROTEIN: 7.4 g/dL (ref 6.0–8.3)
Total Bilirubin: 0.4 mg/dL (ref 0.2–1.2)

## 2013-11-03 LAB — LIPID PANEL
CHOLESTEROL: 210 mg/dL — AB (ref 0–200)
HDL: 35 mg/dL — ABNORMAL LOW (ref >39)
LDL CALC: 109 mg/dL — AB (ref 0–99)
TRIGLYCERIDES: 332 mg/dL — AB (ref <150)
Total CHOL/HDL Ratio: 6 Ratio
VLDL: 66 mg/dL — ABNORMAL HIGH (ref 0–40)

## 2013-11-03 LAB — CBC
HCT: 49 % (ref 39.0–52.0)
Hemoglobin: 16.9 g/dL (ref 13.0–17.0)
MCH: 32.1 pg (ref 26.0–34.0)
MCHC: 34.5 g/dL (ref 30.0–36.0)
MCV: 93 fL (ref 78.0–100.0)
Platelets: 317 10*3/uL (ref 150–400)
RBC: 5.27 MIL/uL (ref 4.22–5.81)
RDW: 13.3 % (ref 11.5–15.5)
WBC: 10.2 10*3/uL (ref 4.0–10.5)

## 2013-11-03 MED ORDER — TESTOSTERONE CYPIONATE 200 MG/ML IM SOLN: 200.0000 mg | INTRAMUSCULAR | Status: DC

## 2013-11-03 NOTE — Patient Instructions (Signed)

## 2013-11-03 NOTE — Progress Notes (Signed)
Pre visit review using our clinic review tool, if applicable. No additional management support is needed unless otherwise documented below in the visit note. 

## 2013-11-03 NOTE — Telephone Encounter (Signed)
Lab order week of 05-07-2014 Return in about 6 months (around 05/06/2014) for lipid, renal, cbc, tsh, hepatic prior. Diagnostic follow-up: Instructions: Check out comments: Testosterone level with labs

## 2013-11-04 LAB — TESTOSTERONE: Testosterone: 596 ng/dL (ref 300–890)

## 2013-11-04 LAB — TSH: TSH: 1.244 u[IU]/mL (ref 0.350–4.500)

## 2013-11-04 LAB — PSA: PSA: 1.37 ng/mL (ref <=4.00)

## 2013-11-05 DIAGNOSIS — E782 Mixed hyperlipidemia: Secondary | ICD-10-CM | POA: Insufficient documentation

## 2013-11-05 NOTE — Assessment & Plan Note (Signed)
Improved with avoidance of dairy, whey and garlic

## 2013-11-05 NOTE — Progress Notes (Signed)
Patient ID: Ryan Dodson, male   DOB: 05/26/1961, 53 y.o.   MRN: [id number] Ryan Dodson [id number] 05/26/1961 11/05/2013      Progress Note-Follow Up  Subjective  Chief Complaint  No chief complaint on file.   HPI  Patient is a 53 year old Caucasian male who is in today for annual exam. She feels well. No recent illness. No chest pain, palpitations or shortness of breath. He's been divorce recently but reports decreased stress. He denies any GI or GU concerns. He was having trouble with congestion and pressure in his ears but he cut out way, dairy and garlic he feels much better.  Past Medical History  Diagnosis Date  . Allergy     Rhinitis  . 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 Reflux 04/06/2011  . RUQ pain 04/06/2011  . Epigastric pain 04/06/2011  . Hernia, umbilical 04/06/2011  . Preventative health care 05/04/2012  . Barrett esophagus 05/04/2012    Past Surgical History  Procedure Laterality Date  . Vasectomy  2001  . Inguinal hernia repair      on right, mesh in place    Family History  Problem Relation Age of Onset  . Hypotension Mother   . Arthritis Father   . Cancer Father     History   Social History  . Marital Status: Married    Spouse Name: N/A    Number of Children: 2  . Years of Education: N/A   Occupational History  . [employer]    Social History Main Topics  . Smoking status: Never Smoker   . Smokeless tobacco: Former NeurosurgeonUser    Quit date: 08/31/1986  . Alcohol Use: No     Comment: socially  . Drug Use: No  . Sexual Activity: Yes   Other Topics Concern  . Not on file   Social History Narrative  . No narrative on file    Current Outpatient Prescriptions on File Prior to Visit  Medication Sig Dispense Refill  . cetirizine-pseudoephedrine (ZYRTEC-D) 5-120 MG per tablet Take 1 tablet by mouth 2 (two) times daily as needed.       Marland Kitchen. omeprazole (PRILOSEC) 20 MG capsule Take 1 capsule (20 mg total) by mouth daily.  30 capsule  3  . SYRINGE-NEEDLE, DISP, 3 ML  (B-D INTEGRA SYRINGE) 22G X 1-1/2" 3 ML MISC 1 dose IM every 2 weeks for testosterone injection  50 each  1   No current facility-administered medications on file prior to visit.    Allergies  Allergen Reactions  . Ciprofloxacin     Review of Systems  Review of Systems  Constitutional: Negative for fever, chills and malaise/fatigue.  HENT: Negative for congestion, hearing loss and nosebleeds.   Eyes: Negative for discharge.  Respiratory: Negative for cough, sputum production, shortness of breath and wheezing.   Cardiovascular: Negative for chest pain, palpitations and leg swelling.  Gastrointestinal: Negative for heartburn, nausea, vomiting, abdominal pain, diarrhea, constipation and blood in stool.  Genitourinary: Negative for dysuria, urgency, frequency and hematuria.  Musculoskeletal: Negative for back pain, falls and myalgias.  Skin: Negative for rash.  Neurological: Negative for dizziness, tremors, sensory change, focal weakness, loss of consciousness, weakness and headaches.  Endo/Heme/Allergies: Negative for polydipsia. Does not bruise/bleed easily.  Psychiatric/Behavioral: Negative for depression and suicidal ideas. The patient is not nervous/anxious and does not have insomnia.     Objective  BP 132/90  Pulse 77  Temp(Src) 98.1 F (36.7 C) (Oral)  Ht 5' 10.5" (1.791 m)  Wt 192  lb (87.091 kg)  BMI 27.15 kg/m2  SpO2 96%  Physical Exam  Physical Exam  Constitutional: He is oriented to person, place, and time and well-developed, well-nourished, and in no distress. No distress.  HENT:  Head: Normocephalic and atraumatic.  Eyes: Conjunctivae are normal.  Neck: Neck supple. No thyromegaly present.  Cardiovascular: Normal rate, regular rhythm and normal heart sounds.   No murmur heard. Pulmonary/Chest: Effort normal and breath sounds normal. No respiratory distress.  Abdominal: He exhibits no distension and no mass. There is no tenderness.  Musculoskeletal: He  exhibits no edema.  Neurological: He is alert and oriented to person, place, and time.  Skin: Skin is warm.  Psychiatric: Memory, affect and judgment normal.    Lab Results  Component Value Date   TSH 1.244 11/03/2013   Lab Results  Component Value Date   WBC 10.2 11/03/2013   HGB 16.9 11/03/2013   HCT 49.0 11/03/2013   MCV 93.0 11/03/2013   PLT 317 11/03/2013   Lab Results  Component Value Date   CREATININE 1.25 11/03/2013   BUN 22 11/03/2013   NA 135 11/03/2013   K 4.4 11/03/2013   CL 99 11/03/2013   CO2 27 11/03/2013   Lab Results  Component Value Date   ALT 35 11/03/2013   AST 34 11/03/2013   ALKPHOS 49 11/03/2013   BILITOT 0.4 11/03/2013   Lab Results  Component Value Date   CHOL 210* 11/03/2013   Lab Results  Component Value Date   HDL 35* 11/03/2013   Lab Results  Component Value Date   LDLCALC 109* 11/03/2013   Lab Results  Component Value Date   TRIG 332* 11/03/2013   Lab Results  Component Value Date   CHOLHDL 6.0 11/03/2013     Assessment & Plan  Chronic allergic rhinitis Improved with avoidance of dairy, whey and garlic  Testosterone deficiency Given refills today, labs checked and wnl today  Gastroesophageal reflux disease with hiatal hernia Managed well with dietary changes and Omeprazole prn  Hernia, umbilical Still symptomatic but tolerable, reducible, may proceed with correction if symptoms worsen  Other and unspecified hyperlipidemia Avoid trans fats, minimize simple carbs and saturated fats, maintain exercise, recheck and if persists may need medicaitons  Preventative health care Encouraged heart healthy diet and regular exercise, encouraged adequate sleep. Annual labs ordered, colonoscopy utd

## 2013-11-05 NOTE — Assessment & Plan Note (Signed)
Given refills today, labs checked and wnl today

## 2013-11-05 NOTE — Assessment & Plan Note (Addendum)
Encouraged heart healthy diet and regular exercise, encouraged adequate sleep. Annual labs ordered, colonoscopy utd

## 2013-11-05 NOTE — Assessment & Plan Note (Signed)
Avoid trans fats, minimize simple carbs and saturated fats, maintain exercise, recheck and if persists may need medicaitons

## 2013-11-05 NOTE — Assessment & Plan Note (Signed)
Managed well with dietary changes and Omeprazole prn

## 2013-11-05 NOTE — Assessment & Plan Note (Signed)
Still symptomatic but tolerable, reducible, may proceed with correction if symptoms worsen

## 2013-12-12 ENCOUNTER — Telehealth: Payer: Self-pay

## 2013-12-12 MED ORDER — ESOMEPRAZOLE MAGNESIUM 40 MG PO CPDR: 40.0000 mg | DELAYED_RELEASE_CAPSULE | Freq: Every day | ORAL | Status: DC

## 2013-12-12 NOTE — Telephone Encounter (Signed)
Pt left a message stating that he has been using over the counter Nexium and would like an RX sent to the [facility]?  Please advise?

## 2013-12-12 NOTE — Telephone Encounter (Signed)
RX sent. Verbal per pt he would like 40 mg

## 2013-12-12 NOTE — Telephone Encounter (Signed)
OK for 20 mg or 40 mg dose

## 2014-01-23 ENCOUNTER — Ambulatory Visit (INDEPENDENT_AMBULATORY_CARE_PROVIDER_SITE_OTHER): Payer: 59 | Admitting: Family Medicine

## 2014-01-23 ENCOUNTER — Telehealth: Payer: Self-pay

## 2014-01-23 ENCOUNTER — Encounter: Payer: Self-pay | Admitting: Family Medicine

## 2014-01-23 ENCOUNTER — Ambulatory Visit (HOSPITAL_BASED_OUTPATIENT_CLINIC_OR_DEPARTMENT_OTHER)
Admission: RE | Admit: 2014-01-23 | Discharge: 2014-01-23 | Disposition: A | Discharge status: Home / Self Care | Payer: 59 | Source: Ambulatory Visit | Attending: Family Medicine | Admitting: Family Medicine

## 2014-01-23 ENCOUNTER — Other Ambulatory Visit: Payer: Self-pay | Admitting: Family Medicine

## 2014-01-23 VITALS — BP 110/64 | HR 70 | Temp 98.2°F | Ht 70.5 in | Wt 190.0 lb

## 2014-01-23 DIAGNOSIS — K429 Umbilical hernia without obstruction or gangrene: Secondary | ICD-10-CM

## 2014-01-23 DIAGNOSIS — E291 Testicular hypofunction: Secondary | ICD-10-CM

## 2014-01-23 DIAGNOSIS — M79609 Pain in unspecified limb: Secondary | ICD-10-CM

## 2014-01-23 DIAGNOSIS — E349 Endocrine disorder, unspecified: Secondary | ICD-10-CM

## 2014-01-23 DIAGNOSIS — M79659 Pain in unspecified thigh: Secondary | ICD-10-CM

## 2014-01-23 DIAGNOSIS — M79652 Pain in left thigh: Secondary | ICD-10-CM

## 2014-01-23 NOTE — Telephone Encounter (Signed)
OK to order doppler study for left thigh pain and hi risk med

## 2014-01-23 NOTE — Progress Notes (Signed)
Pre visit review using our clinic review tool, if applicable. No additional management support is needed unless otherwise documented below in the visit note. 

## 2014-01-23 NOTE — Telephone Encounter (Signed)
Patient called in stating that he is having umbilical hernia surgery on 01-26-14. Pt is concerned though that he may have a DVT?  Pt is coming in at 10:30 for [place] and seeing Dr Abner Greenspan at 11:45.

## 2014-01-23 NOTE — Telephone Encounter (Signed)
Seen today. 

## 2014-01-23 NOTE — Telephone Encounter (Signed)
Please order DVT for left inner thigh about 6 inches above knee.

## 2014-01-23 NOTE — Patient Instructions (Signed)

## 2014-01-28 ENCOUNTER — Encounter: Payer: Self-pay | Admitting: Family Medicine

## 2014-01-28 DIAGNOSIS — M79652 Pain in left thigh: Secondary | ICD-10-CM | POA: Insufficient documentation

## 2014-01-28 NOTE — Progress Notes (Signed)
Patient ID: ,Ryan HoughDavid Dodson male   DOB: Sep 11, 1960, 53 y.o.   MRN: [id number] Ryan RaveDavid L Dodson [id number] Sep 11, 1960 01/28/2014      Progress Note-Follow Up  Subjective  Chief Complaint  Chief Complaint  Patient presents with  . DVT    possibly- left inner thigh    HPI  Patient is a 53 year old male in today for routine medical care. Is in today for her evaluation of left thigh pain. He had been working out in a routine exam. She is concerned that he has a blood clot in his left thigh. He denies chest pain, palpitations or shortness of breath. He's had no recent illness. He has no numbness tingling or weakness into the left foot. Is preparing to have umbilical hernia repair soon. Denies CP/palp/SOB/HA/congestion/fevers/GI or GU c/o. Taking meds as prescribed Past Medical History  Diagnosis Date  . Allergy     Rhinitis  . 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 Reflux 04/06/2011  . RUQ pain 04/06/2011  . Epigastric pain 04/06/2011  . Hernia, umbilical 04/06/2011  . Preventative health care 05/04/2012  . Barrett esophagus 05/04/2012  . Left thigh pain 01/28/2014    Past Surgical History  Procedure Laterality Date  . Vasectomy  2001  . Inguinal hernia repair      on right, mesh in place    Family History  Problem Relation Age of Onset  . Hypotension Mother   . Arthritis Father   . Cancer Father     History   Social History  . Marital Status: Unknown    Spouse Name: N/A    Number of Children: 2  . Years of Education: N/A   Occupational History  . [employer]    Social History Main Topics  . Smoking status: Never Smoker   . Smokeless tobacco: Former NeurosurgeonUser    Quit date: 08/31/1986  . Alcohol Use: No     Comment: socially  . Drug Use: No  . Sexual Activity: Yes   Other Topics Concern  . Not on file   Social History Narrative  . No narrative on file    Current Outpatient Prescriptions on File Prior to Visit  Medication Sig Dispense Refill  . cetirizine-pseudoephedrine (ZYRTEC-D) 5-120  MG per tablet Take 1 tablet by mouth 2 (two) times daily as needed.       Marland Kitchen. esomeprazole (NEXIUM) 40 MG capsule Take 1 capsule (40 mg total) by mouth daily at 12 noon.  30 capsule  3  . ibuprofen (ADVIL,MOTRIN) 200 MG tablet Take 200 mg by mouth every 6 (six) hours as needed.      . SYRINGE-NEEDLE, DISP, 3 ML (B-D INTEGRA SYRINGE) 22G X 1-1/2" 3 ML MISC 1 dose IM every 2 weeks for testosterone injection  50 each  1   No current facility-administered medications on file prior to visit.    Allergies  Allergen Reactions  . Ciprofloxacin     Review of Systems  Review of Systems  Constitutional: Negative for fever and malaise/fatigue.  HENT: Negative for congestion.   Eyes: Negative for discharge.  Respiratory: Negative for shortness of breath.   Cardiovascular: Negative for chest pain, palpitations and leg swelling.  Gastrointestinal: Positive for abdominal pain. Negative for nausea and diarrhea.  Genitourinary: Negative for dysuria.  Musculoskeletal: Negative for falls.       Left anterior thigh pain  Skin: Negative for rash.  Neurological: Negative for loss of consciousness and headaches.  Endo/Heme/Allergies: Negative for polydipsia.  Psychiatric/Behavioral: Negative for depression  and suicidal ideas. The patient is not nervous/anxious and does not have insomnia.     Objective  BP 110/64  Pulse 70  Temp(Src) 98.2 F (36.8 C) (Oral)  Ht 5' 10.5" (1.791 m)  Wt 190 lb (86.183 kg)  BMI 26.87 kg/m2  SpO2 96%  Physical Exam  Physical Exam  Constitutional: He is oriented to person, place, and time and well-developed, well-nourished, and in no distress. No distress.  HENT:  Head: Normocephalic and atraumatic.  Eyes: Conjunctivae are normal.  Neck: Neck supple. No thyromegaly present.  Cardiovascular: Normal rate, regular rhythm and normal heart sounds.   No murmur heard. Varicosity noted inner right thigh. No warmth or erythema  Pulmonary/Chest: Effort normal and breath  sounds normal. No respiratory distress.  Abdominal: He exhibits no distension and no mass. There is no tenderness.  Musculoskeletal: He exhibits no edema.  Neurological: He is alert and oriented to person, place, and time.  Skin: Skin is warm.  Psychiatric: Memory, affect and judgment normal.    Lab Results  Component Value Date   TSH 1.244 11/03/2013   Lab Results  Component Value Date   WBC 10.2 11/03/2013   HGB 16.9 11/03/2013   HCT 49.0 11/03/2013   MCV 93.0 11/03/2013   PLT 317 11/03/2013   Lab Results  Component Value Date   CREATININE 1.25 11/03/2013   BUN 22 11/03/2013   NA 135 11/03/2013   K 4.4 11/03/2013   CL 99 11/03/2013   CO2 27 11/03/2013   Lab Results  Component Value Date   ALT 35 11/03/2013   AST 34 11/03/2013   ALKPHOS 49 11/03/2013   BILITOT 0.4 11/03/2013   Lab Results  Component Value Date   CHOL 210* 11/03/2013   Lab Results  Component Value Date   HDL 35* 11/03/2013   Lab Results  Component Value Date   LDLCALC 109* 11/03/2013   Lab Results  Component Value Date   TRIG 332* 11/03/2013   Lab Results  Component Value Date   CHOLHDL 6.0 11/03/2013     Assessment & Plan  Left thigh pain Ultrasound negative. Varicose veins noted. Encouraged moist heat and report if worse  Hernia, umbilical Is preparing for surgical correction due to pain  Testosterone deficiency Using supplements which increases risk of blood clot but ultrasound neg

## 2014-01-28 NOTE — Assessment & Plan Note (Signed)
Ultrasound negative. Varicose veins noted. Encouraged moist heat and report if worse

## 2014-01-28 NOTE — Assessment & Plan Note (Signed)
Using supplements which increases risk of blood clot but ultrasound neg

## 2014-01-28 NOTE — Assessment & Plan Note (Signed)
Is preparing for surgical correction due to pain

## 2014-03-31 ENCOUNTER — Other Ambulatory Visit: Payer: Self-pay | Admitting: Family Medicine

## 2014-04-02 NOTE — Telephone Encounter (Signed)
Please advise refill? Last RX was done on 11-03-13  If ok fax to [phone]

## 2014-04-03 NOTE — Telephone Encounter (Signed)
RX faxed

## 2014-05-15 ENCOUNTER — Ambulatory Visit: Payer: 59 | Admitting: Family Medicine

## 2014-05-15 DIAGNOSIS — Z0289 Encounter for other administrative examinations: Secondary | ICD-10-CM

## 2014-09-02 ENCOUNTER — Other Ambulatory Visit: Payer: Self-pay | Admitting: Family Medicine

## 2014-09-03 NOTE — Telephone Encounter (Signed)
Pt needs appt for testosterone refill. JG//CMA

## 2014-09-14 ENCOUNTER — Encounter: Payer: Self-pay | Admitting: Family Medicine

## 2014-09-14 ENCOUNTER — Ambulatory Visit (INDEPENDENT_AMBULATORY_CARE_PROVIDER_SITE_OTHER): Payer: 59 | Admitting: Family Medicine

## 2014-09-14 VITALS — BP 130/80 | HR 84 | Temp 98.1°F | Ht 70.5 in | Wt 197.4 lb

## 2014-09-14 DIAGNOSIS — K429 Umbilical hernia without obstruction or gangrene: Secondary | ICD-10-CM

## 2014-09-14 DIAGNOSIS — E291 Testicular hypofunction: Secondary | ICD-10-CM

## 2014-09-14 DIAGNOSIS — E349 Endocrine disorder, unspecified: Secondary | ICD-10-CM

## 2014-09-14 DIAGNOSIS — IMO0001 Reserved for inherently not codable concepts without codable children: Secondary | ICD-10-CM

## 2014-09-14 DIAGNOSIS — E782 Mixed hyperlipidemia: Secondary | ICD-10-CM

## 2014-09-14 DIAGNOSIS — J309 Allergic rhinitis, unspecified: Secondary | ICD-10-CM

## 2014-09-14 DIAGNOSIS — R030 Elevated blood-pressure reading, without diagnosis of hypertension: Secondary | ICD-10-CM

## 2014-09-14 DIAGNOSIS — N4281 Prostatodynia syndrome: Secondary | ICD-10-CM

## 2014-09-14 MED ORDER — TESTOSTERONE CYPIONATE 200 MG/ML IM SOLN: 200.0000 mg | INTRAMUSCULAR | Status: DC

## 2014-09-14 NOTE — Progress Notes (Signed)
Pre visit review using our clinic review tool, if applicable. No additional management support is needed unless otherwise documented below in the visit note. 

## 2014-09-15 LAB — URINALYSIS, ROUTINE W REFLEX MICROSCOPIC
BILIRUBIN URINE: NEGATIVE
Glucose, UA: NEGATIVE mg/dL
HGB URINE DIPSTICK: NEGATIVE
Ketones, ur: NEGATIVE mg/dL
LEUKOCYTES UA: NEGATIVE
NITRITE: NEGATIVE
Protein, ur: NEGATIVE mg/dL
Specific Gravity, Urine: 1.017 (ref 1.005–1.030)
UROBILINOGEN UA: 0.2 mg/dL (ref 0.0–1.0)
pH: 7 (ref 5.0–8.0)

## 2014-09-16 LAB — URINE CULTURE
COLONY COUNT: NO GROWTH
ORGANISM ID, BACTERIA: NO GROWTH

## 2014-09-17 ENCOUNTER — Encounter: Payer: Self-pay | Admitting: *Deleted

## 2014-09-17 ENCOUNTER — Telehealth: Payer: Self-pay | Admitting: Family Medicine

## 2014-09-17 DIAGNOSIS — N4281 Prostatodynia syndrome: Secondary | ICD-10-CM

## 2014-09-17 NOTE — Telephone Encounter (Signed)
Caller name: Ausencio Relation to pt: self Call back number: [phone] Pharmacy:  Reason for call:   Requesting urine results.  Also, requesting a referral to urology this week. He states that he is still having pain. Would like to go to [facility].

## 2014-09-18 NOTE — Telephone Encounter (Signed)
Patient calling back regarding result. Also, requesting this referral

## 2014-09-19 NOTE — Telephone Encounter (Signed)
Called patient regarding results- negative for UTI- and patient stated understanding.  Follow-up with [facility] previously scheduled, referral placed.   eal

## 2014-09-23 ENCOUNTER — Encounter: Payer: Self-pay | Admitting: Family Medicine

## 2014-09-23 DIAGNOSIS — R030 Elevated blood-pressure reading, without diagnosis of hypertension: Secondary | ICD-10-CM | POA: Insufficient documentation

## 2014-09-23 DIAGNOSIS — I10 Essential (primary) hypertension: Secondary | ICD-10-CM

## 2014-09-23 HISTORY — DX: Essential (primary) hypertension: I10

## 2014-09-23 NOTE — Assessment & Plan Note (Signed)
Encouraged heart healthy diet, increase exercise, avoid trans fats, consider a krill oil cap daily 

## 2014-09-23 NOTE — Assessment & Plan Note (Signed)
Feels well, agrees to have lab work done this week to confirm dosing is appropriate. giben refill for now.

## 2014-09-23 NOTE — Assessment & Plan Note (Signed)
Recently had surgery with Dr Lu [facility]uffelStuart at [facility] to correct with good results

## 2014-09-23 NOTE — Assessment & Plan Note (Signed)
No recent flares. May use Zyrtec D prn, warned to minimize Sudafed use.

## 2014-09-23 NOTE — Progress Notes (Signed)
Patient ID: Ryan Dodson, male   DOB: [birth year], 54 y.o.   MRN: [id number]   Ryan RaveDavid L Donaldson  [id number] [birth year] 09/23/2014      Progress Note-Follow Up  Subjective  Chief Complaint  Chief Complaint  Patient presents with  . Follow-up    HPI  Patient is a 54 y.o. male in today for routine medical care. Patient in today for follow up. Doing well. No recent illness. Has recently had a hernia repair performed by Dr [facility]Stuart at [facility] with good results and healed well. Denies CP/palp/SOB/HA/congestion/fevers/GI or GU c/o. Taking meds as prescribed  Past Medical History  Diagnosis Date  . Allergy     Rhinitis  . 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 Reflux 04/06/2011  . RUQ pain 04/06/2011  . Epigastric pain 04/06/2011  . Hernia, umbilical 04/06/2011  . Preventative health care 05/04/2012  . Barrett esophagus 05/04/2012  . Left thigh pain 01/28/2014  . Elevated BP 09/23/2014    Past Surgical History  Procedure Laterality Date  . Vasectomy  2001  . Inguinal hernia repair      on right, mesh in place    Family History  Problem Relation Age of Onset  . Hypotension Mother   . Arthritis Father   . Cancer Father     History   Social History  . Marital Status: Unknown    Spouse Name: N/A    Number of Children: 2  . Years of Education: N/A   Occupational History  . [employer]    Social History Main Topics  . Smoking status: Never Smoker   . Smokeless tobacco: Former NeurosurgeonUser    Quit date: 08/31/1986  . Alcohol Use: No     Comment: socially  . Drug Use: No  . Sexual Activity: Yes   Other Topics Concern  . Not on file   Social History Narrative    Current Outpatient Prescriptions on File Prior to Visit  Medication Sig Dispense Refill  . cetirizine-pseudoephedrine (ZYRTEC-D) 5-120 MG per tablet Take 1 tablet by mouth 2 (two) times daily as needed.     Marland Kitchen. esomeprazole (NEXIUM) 40 MG capsule Take 1 capsule (40 mg total) by mouth daily at 12 noon. 30 capsule 3  . ibuprofen (ADVIL,MOTRIN)  200 MG tablet Take 200 mg by mouth every 6 (six) hours as needed.    . SYRINGE-NEEDLE, DISP, 3 ML (B-D INTEGRA SYRINGE) 22G X 1-1/2" 3 ML MISC 1 dose IM every 2 weeks for testosterone injection 50 each 1   No current facility-administered medications on file prior to visit.    Allergies  Allergen Reactions  . Hydrocodone Other (See Comments)    Patient uuable to breathe  . Ciprofloxacin     Review of Systems  Review of Systems  Constitutional: Negative for fever and malaise/fatigue.  HENT: Negative for congestion.   Eyes: Negative for discharge.  Respiratory: Negative for shortness of breath.   Cardiovascular: Negative for chest pain, palpitations and leg swelling.  Gastrointestinal: Negative for nausea, abdominal pain and diarrhea.  Genitourinary: Negative for dysuria.  Musculoskeletal: Negative for falls.  Skin: Negative for rash.  Neurological: Negative for loss of consciousness and headaches.  Endo/Heme/Allergies: Negative for polydipsia.  Psychiatric/Behavioral: Negative for depression and suicidal ideas. The patient is not nervous/anxious and does not have insomnia.     Objective  BP 130/80 mmHg  Pulse 84  Temp(Src) 98.1 F (36.7 C) (Oral)  Ht 5' 10.5" (1.791 m)  Wt 197 lb 6.4 oz (89.54 kg)  BMI 27.91 kg/m2  SpO2 97%  Physical Exam  Physical Exam  Constitutional: He is oriented to person, place, and time and well-developed, well-nourished, and in no distress. No distress.  HENT:  Head: Normocephalic and atraumatic.  Eyes: Conjunctivae are normal.  Neck: Neck supple. No thyromegaly present.  Cardiovascular: Normal rate, regular rhythm and normal heart sounds.   No murmur heard. Pulmonary/Chest: Effort normal and breath sounds normal. No respiratory distress.  Abdominal: He exhibits no distension and no mass. There is no tenderness.  Musculoskeletal: He exhibits no edema.  Neurological: He is alert and oriented to person, place, and time.  Skin: Skin is  warm.  Psychiatric: Memory, affect and judgment normal.    Lab Results  Component Value Date   TSH 1.244 11/03/2013   Lab Results  Component Value Date   WBC 10.2 11/03/2013   HGB 16.9 11/03/2013   HCT 49.0 11/03/2013   MCV 93.0 11/03/2013   PLT 317 11/03/2013   Lab Results  Component Value Date   CREATININE 1.25 11/03/2013   BUN 22 11/03/2013   NA 135 11/03/2013   K 4.4 11/03/2013   CL 99 11/03/2013   CO2 27 11/03/2013   Lab Results  Component Value Date   ALT 35 11/03/2013   AST 34 11/03/2013   ALKPHOS 49 11/03/2013   BILITOT 0.4 11/03/2013   Lab Results  Component Value Date   CHOL 210* 11/03/2013   Lab Results  Component Value Date   HDL 35* 11/03/2013   Lab Results  Component Value Date   LDLCALC 109* 11/03/2013   Lab Results  Component Value Date   TRIG 332* 11/03/2013   Lab Results  Component Value Date   CHOLHDL 6.0 11/03/2013     Assessment & Plan  Chronic allergic rhinitis No recent flares. May use Zyrtec D prn, warned to minimize Sudafed use.   Testosterone deficiency Feels well, agrees to have lab work done this week to confirm dosing is appropriate. giben refill for now.   Hernia, umbilical Recently had surgery with Dr Lu D[facility]el at [facility] to correct with good results   Hyperlipidemia, mixed Encouraged heart healthy diet, increase exercise, avoid trans fats, consider a krill oil cap daily   Elevated BP Improved on recheck and he reports 120 to 130s at home over 80s. Encouraged heart healthy diet such as the DASH diet and exercise as tolerated.

## 2014-09-23 NOTE — Assessment & Plan Note (Signed)
Improved on recheck and he reports 120 to 130s at home over 80s. Encouraged heart healthy diet such as the DASH diet and exercise as tolerated.

## 2014-09-24 ENCOUNTER — Other Ambulatory Visit (INDEPENDENT_AMBULATORY_CARE_PROVIDER_SITE_OTHER): Payer: 59

## 2014-09-24 DIAGNOSIS — E782 Mixed hyperlipidemia: Secondary | ICD-10-CM

## 2014-09-24 DIAGNOSIS — R030 Elevated blood-pressure reading, without diagnosis of hypertension: Secondary | ICD-10-CM

## 2014-09-24 DIAGNOSIS — IMO0001 Reserved for inherently not codable concepts without codable children: Secondary | ICD-10-CM

## 2014-09-24 DIAGNOSIS — E349 Endocrine disorder, unspecified: Secondary | ICD-10-CM

## 2014-09-24 DIAGNOSIS — E291 Testicular hypofunction: Secondary | ICD-10-CM

## 2014-09-24 LAB — LIPID PANEL
Cholesterol: 202 mg/dL — ABNORMAL HIGH (ref 0–200)
HDL: 35.8 mg/dL — ABNORMAL LOW (ref >39.00)
NonHDL: 166.2
Total CHOL/HDL Ratio: 6
Triglycerides: 279 mg/dL — ABNORMAL HIGH (ref 0.0–149.0)
VLDL: 55.8 mg/dL — AB (ref 0.0–40.0)

## 2014-09-24 LAB — CBC
HCT: 53 % — ABNORMAL HIGH (ref 39.0–52.0)
Hemoglobin: 17.9 g/dL — ABNORMAL HIGH (ref 13.0–17.0)
MCHC: 33.9 g/dL (ref 30.0–36.0)
MCV: 91.9 fl (ref 78.0–100.0)
PLATELETS: 286 10*3/uL (ref 150.0–400.0)
RBC: 5.77 Mil/uL (ref 4.22–5.81)
RDW: 13.5 % (ref 11.5–15.5)
WBC: 7.5 10*3/uL (ref 4.0–10.5)

## 2014-09-24 LAB — TESTOSTERONE: Testosterone: 148.26 ng/dL — ABNORMAL LOW (ref 300.00–890.00)

## 2014-09-24 LAB — LDL CHOLESTEROL, DIRECT: LDL DIRECT: 133 mg/dL

## 2014-09-25 ENCOUNTER — Telehealth: Payer: Self-pay | Admitting: Family Medicine

## 2014-09-25 DIAGNOSIS — R631 Polydipsia: Secondary | ICD-10-CM

## 2014-09-25 NOTE — Telephone Encounter (Signed)
Caller name: Elo Relation to pt: self Call back number: [phone] Pharmacy:  Reason for call:   Patient requesting last lab results.

## 2014-09-25 NOTE — Telephone Encounter (Signed)
Reviewed labs with patient who states understanding. Patient is concerned because he is drinking a lot of water everyday and cannot seem to get his thirst quenched. Please advise.

## 2014-09-26 NOTE — Telephone Encounter (Signed)
Patient notified, lab scheduled, order entered.

## 2014-09-26 NOTE — Telephone Encounter (Signed)
Have him come in and do a fasting CMP so I can check his sugar.

## 2014-10-05 ENCOUNTER — Other Ambulatory Visit: Payer: 59

## 2014-10-21 ENCOUNTER — Other Ambulatory Visit: Payer: Self-pay | Admitting: Family Medicine

## 2014-11-14 ENCOUNTER — Telehealth: Payer: Self-pay | Admitting: Family Medicine

## 2014-11-14 NOTE — Telephone Encounter (Signed)
Caller name: Lujean RaveHarris, Kingstyn L Relation to pt: self  Call back number: [phone]   Reason for call:  pt states he  needs the last report regarding blood work, pt would like to pick up report

## 2014-11-15 NOTE — Telephone Encounter (Signed)
Patient informed to pickup lab results at the front desk.

## 2015-01-10 ENCOUNTER — Other Ambulatory Visit: Payer: Self-pay | Admitting: Family Medicine

## 2015-01-10 DIAGNOSIS — E291 Testicular hypofunction: Secondary | ICD-10-CM

## 2015-01-10 DIAGNOSIS — E349 Endocrine disorder, unspecified: Secondary | ICD-10-CM

## 2015-01-10 MED ORDER — TESTOSTERONE CYPIONATE 200 MG/ML IM SOLN: 200.0000 mg | INTRAMUSCULAR | Status: DC

## 2015-01-10 NOTE — Telephone Encounter (Signed)
Ok one refill then needs appt

## 2015-01-10 NOTE — Telephone Encounter (Signed)
Refill request from [facility] for Testosterone cyp Last refill on 09/14/14 for 10 ml  0 refills Last office visit 09/14/14 No future appt. Scheduled at this time  Advise on refill

## 2015-01-10 NOTE — Telephone Encounter (Signed)
Called the patient left a detailed message to pickup hardcopy of testosterone prescription and to schedule appointment for further refill.

## 2015-02-26 ENCOUNTER — Encounter: Payer: Self-pay | Admitting: Family Medicine

## 2015-02-26 ENCOUNTER — Ambulatory Visit (INDEPENDENT_AMBULATORY_CARE_PROVIDER_SITE_OTHER): Payer: 59 | Admitting: Family Medicine

## 2015-02-26 VITALS — BP 128/83 | HR 71 | Temp 97.9°F | Resp 16 | Ht 70.5 in | Wt 189.0 lb

## 2015-02-26 DIAGNOSIS — S0093XA Contusion of unspecified part of head, initial encounter: Secondary | ICD-10-CM

## 2015-02-26 NOTE — Progress Notes (Signed)
Pre visit review using our clinic review tool, if applicable. No additional management support is needed unless otherwise documented below in the visit note. 

## 2015-02-26 NOTE — Progress Notes (Signed)
OFFICE NOTE  02/26/2015  CC:  Chief Complaint  Patient presents with  . Head Injury    Hit his head on trunk of car, felt really wooze a few days later, stated that he has had a concussion in the past and this feels about the same.   HPI: Patient is a 54 y.o. Caucasian male who is here for recent head trauma.   Hit crown of head on trunk lid of his car, occurred about 10d ago.  No LOC.  No vision abnormalities, no wooziness, no dizziness, no memory abnormalities.  A few days later he began having a HA in the area of the contusion, neck has hurt some lately, also some brief periods of mild disequilibrium.  Generalized fatigue and wants to sleep more.   No vomiting, no memory problems or cognitive complaints.  Appetite is good.  No tremors or focal weakness.  No personality changes.  No hearing complaints or vision complaints.  Speech has been normal.  Pertinent PMH:  Past Medical History  Diagnosis Date  . Allergy     Rhinitis  . 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 Reflux 04/06/2011  . RUQ pain 04/06/2011  . Epigastric pain 04/06/2011  . Hernia, umbilical 04/06/2011  . Preventative health care 05/04/2012  . Barrett esophagus 05/04/2012  . Left thigh pain 01/28/2014  . Elevated BP 09/23/2014   Past Surgical History  Procedure Laterality Date  . Vasectomy  2001  . Inguinal hernia repair      on right, mesh in place    MEDS:  Outpatient Prescriptions Prior to Visit  Medication Sig Dispense Refill  . aspirin 81 MG chewable tablet Chew 81 mg by mouth.    . B-D 3CC LUER-LOK SYR 22GX1-1/2 22G X 1-1/2" 3 ML MISC 1 DOSE IM EVERY 2 WEEKS FOR TESTOSTERONE INJECTION 50 each 0  . cetirizine-pseudoephedrine (ZYRTEC-D) 5-120 MG per tablet Take 1 tablet by mouth 2 (two) times daily as needed.     Marland Kitchen ibuprofen (ADVIL,MOTRIN) 200 MG tablet Take 200 mg by mouth every 6 (six) hours as needed.    . testosterone cypionate (DEPOTESTOTERONE CYPIONATE) 200 MG/ML injection Inject 1 mL (200 mg total) into the muscle  every 14 (fourteen) days. 10 mL 0  . esomeprazole (NEXIUM) 40 MG capsule Take 1 capsule (40 mg total) by mouth daily at 12 noon. (Patient not taking: Reported on 02/26/2015) 30 capsule 3   No facility-administered medications prior to visit.    PE: Blood pressure 128/83, pulse 71, temperature 97.9 F (36.6 C), temperature source Oral, resp. rate 16, height 5' 10.5" (1.791 m), weight 189 lb (85.73 kg), SpO2 97 %. Gen: Alert, well appearing.  Patient is oriented to person, place, time, and situation. AFFECT: pleasant, lucid thought and speech. HEAD: mild/mod focal tenderness over L aspect of vertex of head, without swelling or ecchymoses or skin/hair defect. ENT: Ears: EACs clear, normal epithelium.  TMs with good light reflex and landmarks bilaterally.  Eyes: no injection, icteris, swelling, or exudate.  EOMI, PERRLA. Nose: no drainage or turbinate edema/swelling.  No injection or focal lesion.  Mouth: lips without lesion/swelling.  Oral mucosa pink and moist.  Dentition intact and without obvious caries or gingival swelling.  Oropharynx without erythema, exudate, or swelling.  Neck - No masses or thyromegaly or limitation in range of motion Neuro: CN 2-12 intact bilaterally, strength 5/5 in proximal and distal upper extremities and lower extremities bilaterally.  No sensory deficits.  No tremor.  No disdiadochokinesis.  No ataxia.  Upper extremity and lower extremity DTRs symmetric.  No pronator drift.   IMPRESSION AND PLAN:  Head contusion. Concussion possibly, mild if anything. I do not see any sign of intracranial abnormality today. We discussed radiologic options today (noncontrast head CT vs x-ray of skull) and we decided to go with x-ray of skull to r/o skull fracture.  He declined pain meds today.  An After Visit Summary was printed and given to the patient.  FOLLOW UP: prn

## 2015-05-09 IMAGING — US US EXTREM LOW VENOUS*L*
1 series · 14 of 24 positions shown · non-contrast
Comparison: None

CLINICAL DATA: Medial thigh pain

EXAM:
LEFT LOWER EXTREMITY VENOUS DOPPLER ULTRASOUND
TECHNIQUE: Gray-scale sonography with compression, as well as color and duplex
ultrasound, were performed to evaluate the deep venous system from
the level of the common femoral vein through the popliteal and
proximal calf veins.

[Series 1: us extrem low venous*left* · 14 of 24 slices shown]
[im 1/24]
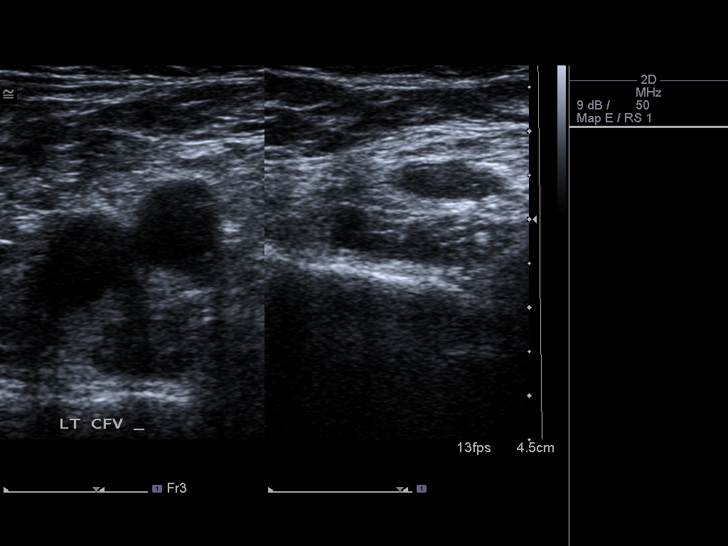
[im 3/24]
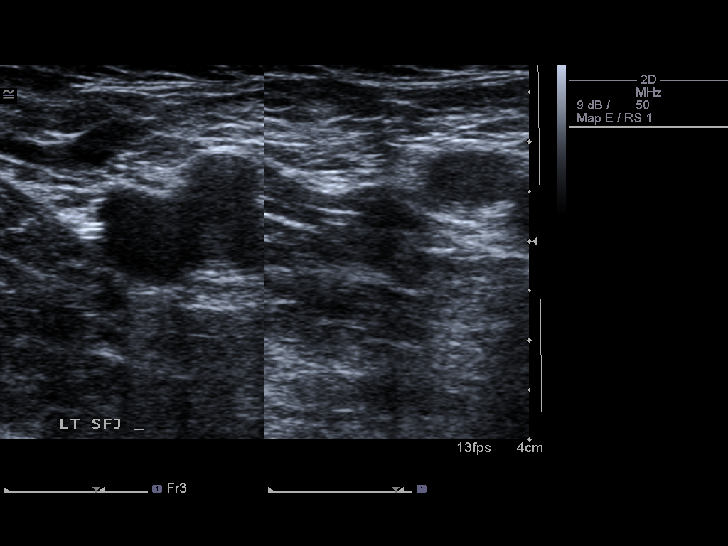
[im 5/24]
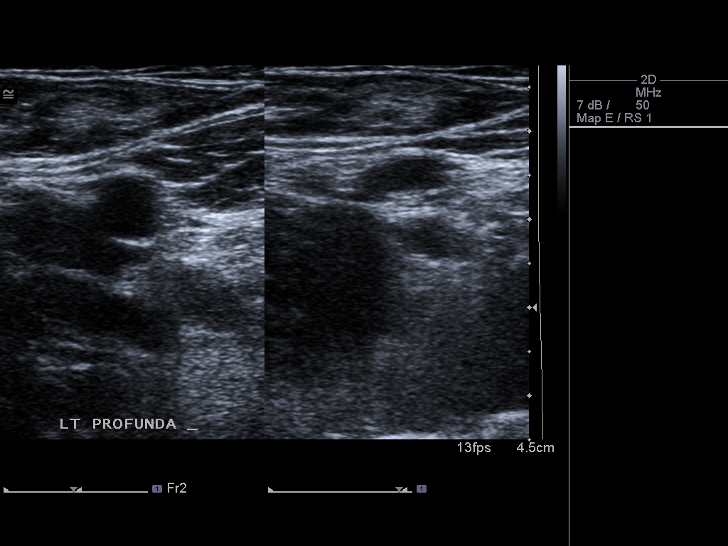
[im 7/24]
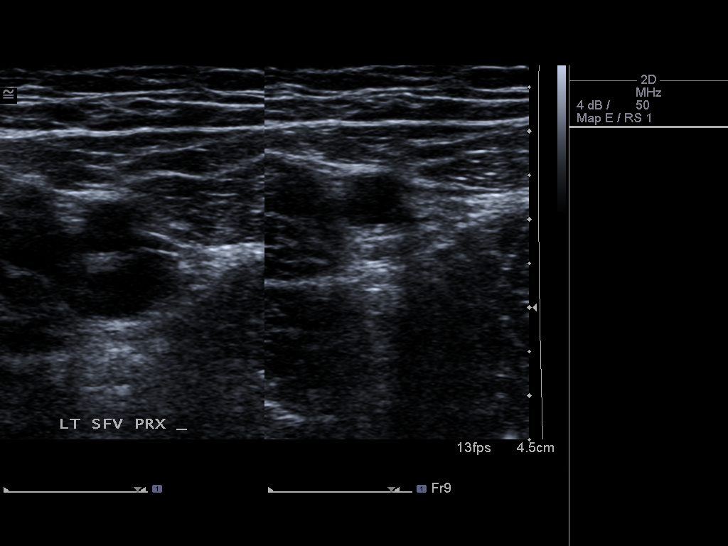
[im 8/24]
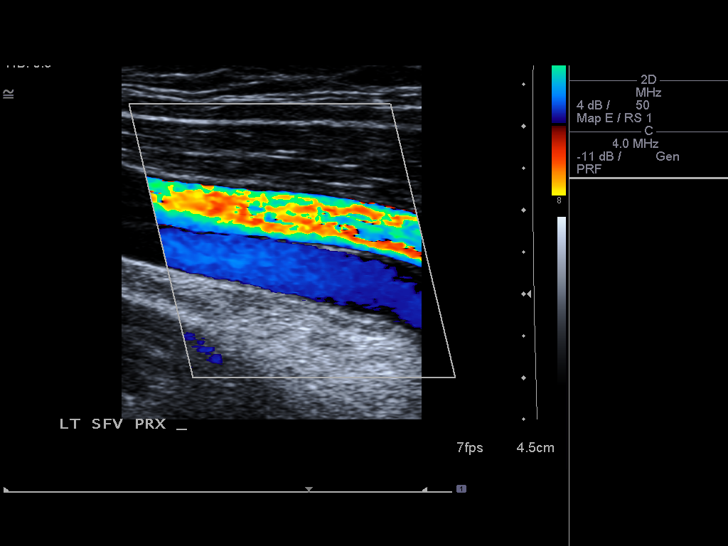
[im 10/24]
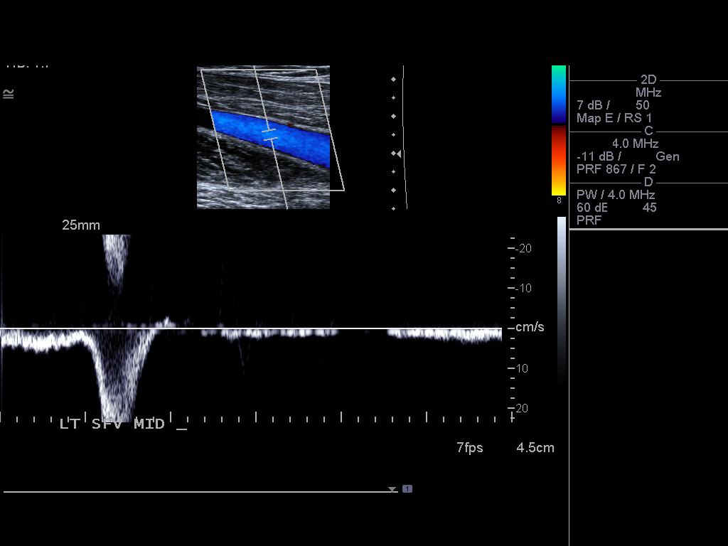
[im 12/24]
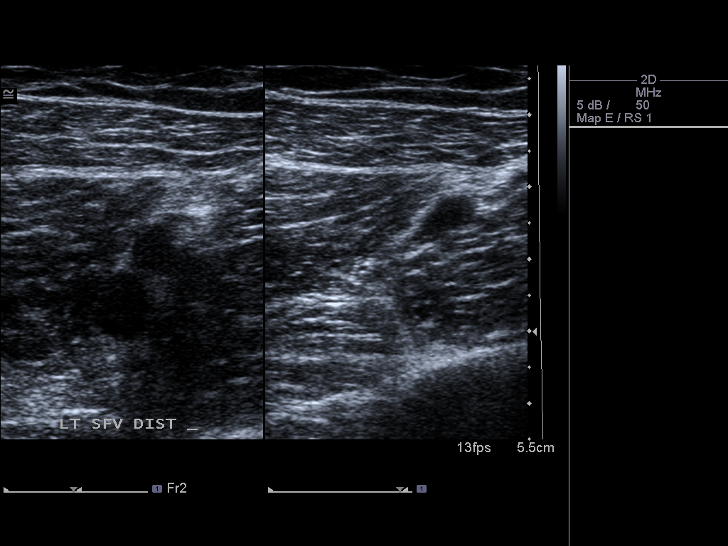
[im 13/24]
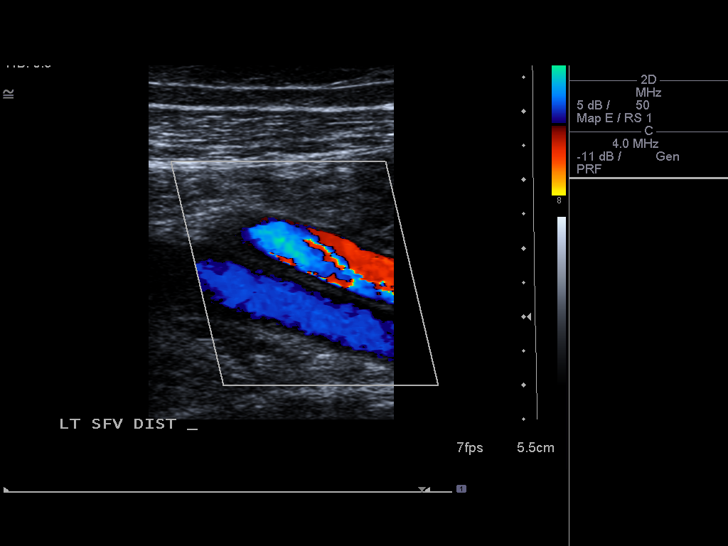
[im 15/24]
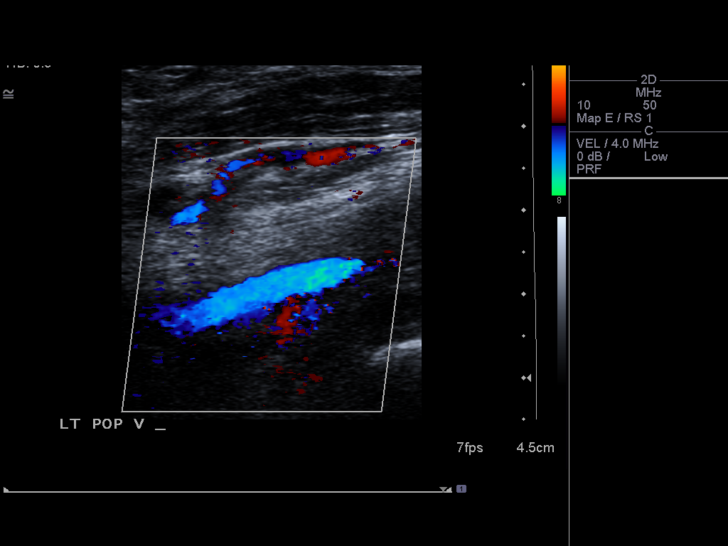
[im 17/24]
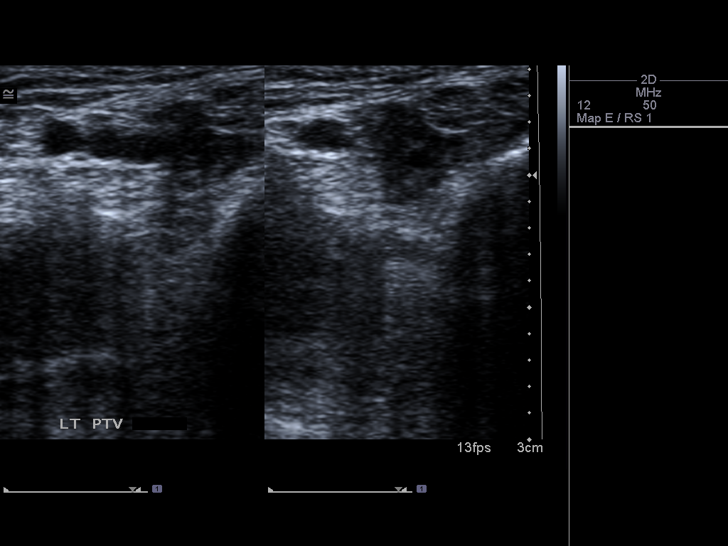
[im 19/24]
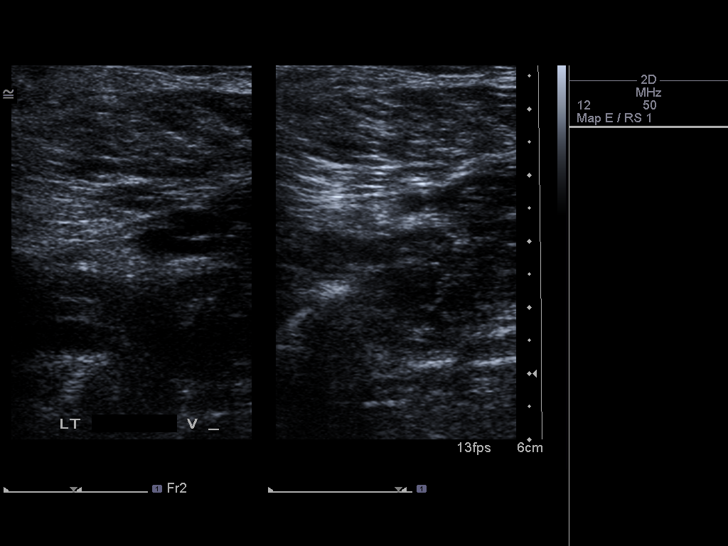
[im 20/24]
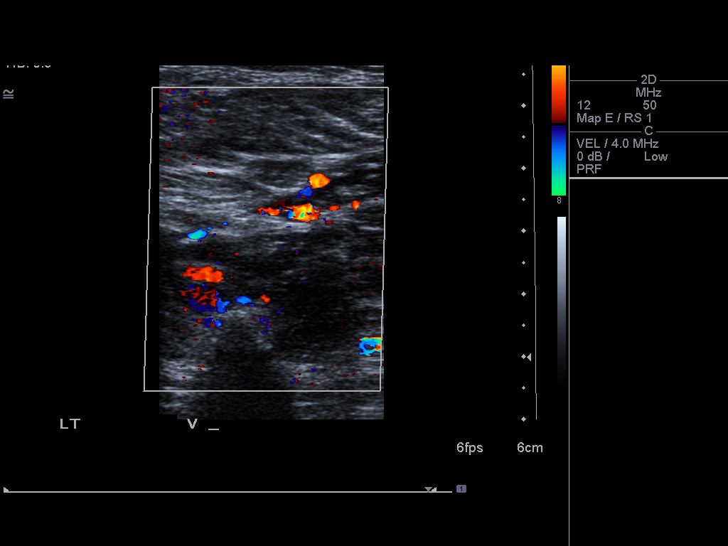
[im 22/24]
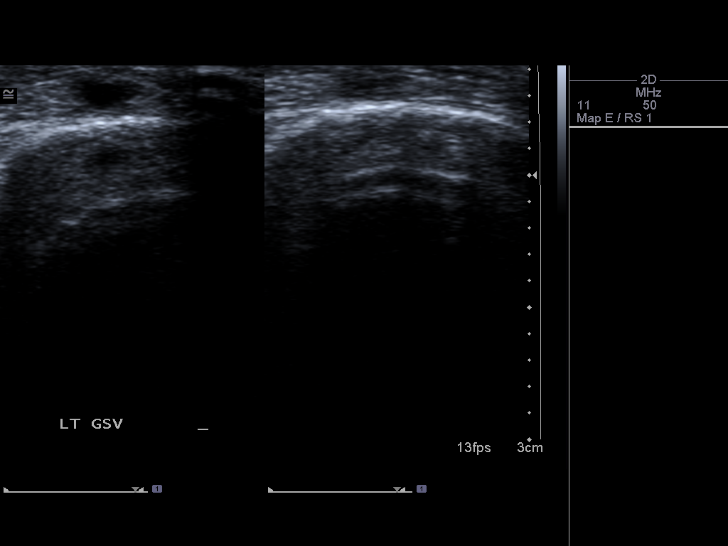
[im 24/24]
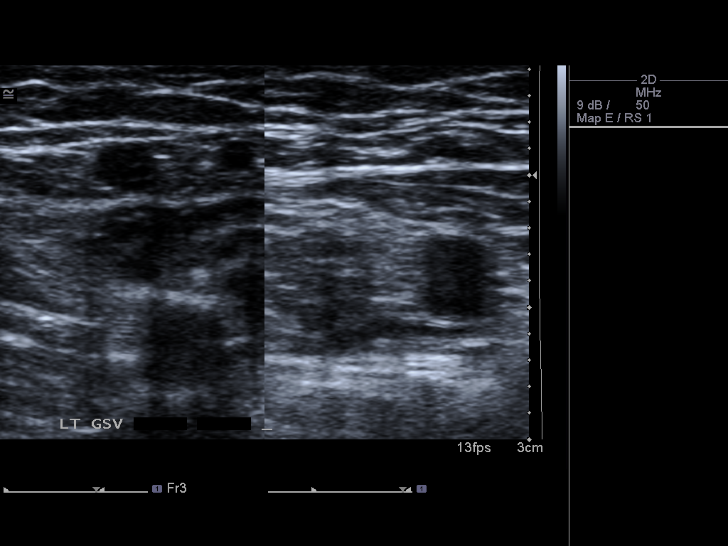

[14 of 24 positions shown; findings below may reference images not displayed]

FINDINGS: Normal compressibility of the common femoral, superficial femoral,
and popliteal veins, as well as the proximal calf veins. No filling
defects to suggest DVT on grayscale or color Doppler imaging.
Doppler waveforms show normal direction of venous flow, normal
respiratory phasicity and response to augmentation. Visualized
segments of the saphenous venous system normal in caliber and
compressibility.
IMPRESSION: No evidence of  lower extremity deep vein thrombosis.

## 2015-05-16 ENCOUNTER — Encounter: Payer: 59 | Admitting: Family Medicine

## 2015-05-27 ENCOUNTER — Other Ambulatory Visit: Payer: Self-pay | Admitting: Family Medicine

## 2015-05-28 NOTE — Telephone Encounter (Signed)
Faxed hardcopy for Testosterone to [facility].

## 2015-09-30 ENCOUNTER — Other Ambulatory Visit: Payer: Self-pay | Admitting: Family Medicine

## 2015-10-03 ENCOUNTER — Encounter: Payer: Self-pay | Admitting: Family Medicine

## 2015-10-03 ENCOUNTER — Other Ambulatory Visit: Payer: Self-pay | Admitting: Family Medicine

## 2015-10-03 ENCOUNTER — Ambulatory Visit (INDEPENDENT_AMBULATORY_CARE_PROVIDER_SITE_OTHER): Payer: 59 | Admitting: Family Medicine

## 2015-10-03 VITALS — BP 136/77 | HR 89 | Temp 98.0°F | Resp 16 | Ht 70.5 in | Wt 194.8 lb

## 2015-10-03 DIAGNOSIS — D751 Secondary polycythemia: Secondary | ICD-10-CM

## 2015-10-03 DIAGNOSIS — N5201 Erectile dysfunction due to arterial insufficiency: Secondary | ICD-10-CM | POA: Diagnosis not present

## 2015-10-03 DIAGNOSIS — E291 Testicular hypofunction: Secondary | ICD-10-CM

## 2015-10-03 DIAGNOSIS — K219 Gastro-esophageal reflux disease without esophagitis: Secondary | ICD-10-CM | POA: Diagnosis not present

## 2015-10-03 LAB — CBC
HEMATOCRIT: 50.3 % (ref 39.0–52.0)
HEMOGLOBIN: 16.8 g/dL (ref 13.0–17.0)
MCH: 30.9 pg (ref 26.0–34.0)
MCHC: 33.4 g/dL (ref 30.0–36.0)
MCV: 92.5 fL (ref 78.0–100.0)
MPV: 11.1 fL (ref 8.6–12.4)
Platelets: 286 10*3/uL (ref 150–400)
RBC: 5.44 MIL/uL (ref 4.22–5.81)
RDW: 13.6 % (ref 11.5–15.5)
WBC: 9.2 10*3/uL (ref 4.0–10.5)

## 2015-10-03 MED ORDER — TESTOSTERONE CYPIONATE 200 MG/ML IM SOLN: INTRAMUSCULAR | Status: DC

## 2015-10-03 MED ORDER — SILDENAFIL CITRATE 20 MG PO TABS: ORAL_TABLET | ORAL | Status: DC

## 2015-10-03 NOTE — Progress Notes (Signed)
Pre visit review using our clinic review tool, if applicable. No additional management support is needed unless otherwise documented below in the visit note. 

## 2015-10-03 NOTE — Progress Notes (Signed)
OFFICE VISIT  10/03/2015   CC:  Chief Complaint  Patient presents with  . Follow-up    Pt is not fasting.    HPI:    Patient is a 55 y.o. Caucasian male who presents for f/u hypotestosteronism. I last saw him 01/2015 for a different issue. He last followed up for his hypogonadism with Dr. Abner Greenspan 08/2014, at which time his Hb was 17.9.  He was instructed to return for lab recheck in 3 mo but doesn't look like this happened. He says he cut back on his dose to 1/2 ml q2 wks for about 6 mo, then resumed 1 ml q2 wks about 1 mo ago.  Most recent injection was 7d/a.  Says he feels great.  Sees urologist for erectile dysfunction (Dr. Edwyna Shell), and says he got a DRE done about 3 mo ago at his office, but pt says a PSA was not done.  Therefore, pt deferred rectal exam today but said PSA was fine to get.  GERD: well controlled as long as he takes PPI qd.  Past Medical History  Diagnosis Date  . Seasonal allergic rhinitis   . Hypogonadism in male 02/09/2011  . GERD (gastroesophageal reflux disease) 04/06/2011  . Hernia, umbilical 04/06/2011  . Barrett esophagus 05/04/2012  . Elevated blood pressure reading without diagnosis of hypertension 09/23/2014    Past Surgical History  Procedure Laterality Date  . Vasectomy  2001  . Inguinal hernia repair      on right, mesh in place  . Esophagogastroduodenoscopy  05/14/11    Done for epigastric pain: distal esoph diverticulum, Barrett's esoph at GE jxn, hiatal hernia    Outpatient Prescriptions Prior to Visit  Medication Sig Dispense Refill  . aspirin 81 MG chewable tablet Chew 81 mg by mouth.    . B-D 3CC LUER-LOK SYR 22GX1-1/2 22G X 1-1/2" 3 ML MISC 1 DOSE IM EVERY 2 WEEKS FOR TESTOSTERONE INJECTION 50 each 0  . ibuprofen (ADVIL,MOTRIN) 200 MG tablet Take 200 mg by mouth every 6 (six) hours as needed.    . testosterone cypionate (DEPOTESTOSTERONE CYPIONATE) 200 MG/ML injection INJECT INTO THE MUSCLE EVERY 14 DAYS 10 mL 0  . cetirizine-pseudoephedrine  (ZYRTEC-D) 5-120 MG per tablet Take 1 tablet by mouth 2 (two) times daily as needed. Reported on 10/03/2015    . esomeprazole (NEXIUM) 40 MG capsule Take 1 capsule (40 mg total) by mouth daily at 12 noon. (Patient not taking: Reported on 02/26/2015) 30 capsule 3   No facility-administered medications prior to visit.    Allergies  Allergen Reactions  . Hydrocodone Other (See Comments)    Patient uuable to breathe  . Ciprofloxacin     ROS As per HPI  PE: Blood pressure 136/77, pulse 89, temperature 98 F (36.7 C), temperature source Oral, resp. rate 16, height 5' 10.5" (1.791 m), weight 194 lb 12 oz (88.338 kg), SpO2 95 %. Gen: Alert, well appearing.  Patient is oriented to person, place, time, and situation. ZOX:WRUE: no injection, icteris, swelling, or exudate.  EOMI, PERRLA. Mouth: lips without lesion/swelling.  Oral mucosa pink and moist. Oropharynx without erythema, exudate, or swelling.  CV: RRR, no m/r/g.   LUNGS: CTA bilat, nonlabored resps, good aeration in all lung fields. EXT: no clubbing, cyanosis, or edema.   LABS:  Lab Results  Component Value Date   TESTOSTERONE 148.26* 09/24/2014   Lab Results  Component Value Date   TSH 1.244 11/03/2013   Lab Results  Component Value Date   WBC  7.5 09/24/2014   HGB 17.9 Repeated and verified X2.* 09/24/2014   HCT 53.0* 09/24/2014   MCV 91.9 09/24/2014   PLT 286.0 09/24/2014   Lab Results  Component Value Date   CREATININE 1.25 11/03/2013   BUN 22 11/03/2013   NA 135 11/03/2013   K 4.4 11/03/2013   CL 99 11/03/2013   CO2 27 11/03/2013   Lab Results  Component Value Date   ALT 35 11/03/2013   AST 34 11/03/2013   ALKPHOS 49 11/03/2013   BILITOT 0.4 11/03/2013   Lab Results  Component Value Date   CHOL 202* 09/24/2014   Lab Results  Component Value Date   HDL 35.80* 09/24/2014   Lab Results  Component Value Date   LDLCALC 109* 11/03/2013   Lab Results  Component Value Date   TRIG 279.0* 09/24/2014    Lab Results  Component Value Date   CHOLHDL 6 09/24/2014   Lab Results  Component Value Date   PSA 1.37 11/03/2013   PSA 1.29 05/04/2012   IMPRESSION AND PLAN:  1) Male hypogonadism: check testosterone (last injection was 1 wk ago, 200 mg). Pt feeling great. Check PSA and CBC.  2) Polycythemia, secondary to testosterone supplement: pt appropriately decreased his dose for 6 mo, then just recently went back to 200 mg q2 wks dosing.  Recheck CBC today.  3) Erectile dysfunction: per pt's admission it is mild but it does respond well to 40-50 mg viagra dosing.  I agreed to assume rx responsibility for this med per pt request so he doesn't have to pay a high specialist fee for urologist in the future.  4) GERD: The current medical regimen is effective;  continue present plan and medications.  FOLLOW UP: Return in about 6 months (around 04/01/2016) for annual CPE (fasting).

## 2015-10-04 LAB — COMPREHENSIVE METABOLIC PANEL
ALBUMIN: 4.4 g/dL (ref 3.6–5.1)
ALK PHOS: 51 U/L (ref 40–115)
ALT: 38 U/L (ref 9–46)
AST: 33 U/L (ref 10–35)
BILIRUBIN TOTAL: 0.4 mg/dL (ref 0.2–1.2)
BUN: 19 mg/dL (ref 7–25)
CALCIUM: 9.4 mg/dL (ref 8.6–10.3)
CO2: 23 mmol/L (ref 20–31)
Chloride: 100 mmol/L (ref 98–110)
Creat: 1.42 mg/dL — ABNORMAL HIGH (ref 0.70–1.33)
GLUCOSE: 75 mg/dL (ref 65–99)
POTASSIUM: 4.4 mmol/L (ref 3.5–5.3)
Sodium: 135 mmol/L (ref 135–146)
Total Protein: 7.1 g/dL (ref 6.1–8.1)

## 2015-10-04 LAB — TESTOSTERONE: TESTOSTERONE: 934 ng/dL — AB (ref 250–827)

## 2015-10-04 LAB — PSA: PSA: 1.09 ng/mL (ref <=4.00)

## 2015-10-15 ENCOUNTER — Telehealth: Payer: Self-pay | Admitting: *Deleted

## 2015-10-15 MED ORDER — SILDENAFIL CITRATE 20 MG PO TABS: ORAL_TABLET | ORAL | Status: DC

## 2015-10-15 NOTE — Telephone Encounter (Signed)
Received PA for sildenafil. Spoke to pt and he requested to p/u printed Rx to send to a online pharmacy where he can get this cheaper. Rx printed and on you desk please sign. Thanks.

## 2016-04-02 ENCOUNTER — Encounter: Payer: 59 | Admitting: Family Medicine

## 2016-04-03 ENCOUNTER — Encounter: Payer: 59 | Admitting: Family Medicine

## 2016-08-14 ENCOUNTER — Other Ambulatory Visit: Payer: Self-pay | Admitting: Family Medicine

## 2016-08-14 ENCOUNTER — Ambulatory Visit (INDEPENDENT_AMBULATORY_CARE_PROVIDER_SITE_OTHER): Payer: 59 | Admitting: Family Medicine

## 2016-08-14 ENCOUNTER — Encounter: Payer: Self-pay | Admitting: Family Medicine

## 2016-08-14 VITALS — BP 147/86 | HR 68 | Temp 98.3°F | Resp 16 | Ht 70.5 in | Wt 188.5 lb

## 2016-08-14 DIAGNOSIS — Z0000 Encounter for general adult medical examination without abnormal findings: Secondary | ICD-10-CM

## 2016-08-14 DIAGNOSIS — B370 Candidal stomatitis: Secondary | ICD-10-CM

## 2016-08-14 DIAGNOSIS — E291 Testicular hypofunction: Secondary | ICD-10-CM

## 2016-08-14 LAB — CBC WITH DIFFERENTIAL/PLATELET
BASOS PCT: 0.5 % (ref 0.0–3.0)
Basophils Absolute: 0 10*3/uL (ref 0.0–0.1)
EOS PCT: 1.8 % (ref 0.0–5.0)
Eosinophils Absolute: 0.1 10*3/uL (ref 0.0–0.7)
HEMATOCRIT: 50.6 % (ref 39.0–52.0)
HEMOGLOBIN: 17 g/dL (ref 13.0–17.0)
LYMPHS PCT: 20.4 % (ref 12.0–46.0)
Lymphs Abs: 1.7 10*3/uL (ref 0.7–4.0)
MCHC: 33.5 g/dL (ref 30.0–36.0)
MCV: 92.6 fl (ref 78.0–100.0)
MONO ABS: 0.9 10*3/uL (ref 0.1–1.0)
MONOS PCT: 10.1 % (ref 3.0–12.0)
Neutro Abs: 5.7 10*3/uL (ref 1.4–7.7)
Neutrophils Relative %: 67.2 % (ref 43.0–77.0)
Platelets: 336 10*3/uL (ref 150.0–400.0)
RBC: 5.47 Mil/uL (ref 4.22–5.81)
RDW: 14.1 % (ref 11.5–15.5)
WBC: 8.5 10*3/uL (ref 4.0–10.5)

## 2016-08-14 LAB — COMPREHENSIVE METABOLIC PANEL
ALBUMIN: 4.9 g/dL (ref 3.5–5.2)
ALK PHOS: 53 U/L (ref 39–117)
ALT: 69 U/L — ABNORMAL HIGH (ref 0–53)
AST: 31 U/L (ref 0–37)
BUN: 20 mg/dL (ref 6–23)
CALCIUM: 9.9 mg/dL (ref 8.4–10.5)
CO2: 32 mEq/L (ref 19–32)
Chloride: 98 mEq/L (ref 96–112)
Creatinine, Ser: 1.07 mg/dL (ref 0.40–1.50)
GFR: 75.78 mL/min (ref >60.00)
Glucose, Bld: 80 mg/dL (ref 70–99)
POTASSIUM: 4.7 meq/L (ref 3.5–5.1)
Sodium: 138 mEq/L (ref 135–145)
TOTAL PROTEIN: 7.5 g/dL (ref 6.0–8.3)
Total Bilirubin: 0.4 mg/dL (ref 0.2–1.2)

## 2016-08-14 LAB — TESTOSTERONE: Testosterone: 375.41 ng/dL (ref 300.00–890.00)

## 2016-08-14 LAB — TSH: TSH: 1.67 u[IU]/mL (ref 0.35–4.50)

## 2016-08-14 MED ORDER — TESTOSTERONE CYPIONATE 200 MG/ML IM SOLN: INTRAMUSCULAR | 1 refills | Status: DC

## 2016-08-14 MED ORDER — NYSTATIN 100000 UNIT/ML MT SUSP: 5.0000 mL | Freq: Four times a day (QID) | OROMUCOSAL | 0 refills | Status: DC

## 2016-08-14 NOTE — Progress Notes (Signed)
Pre visit review using our clinic review tool, if applicable. No additional management support is needed unless otherwise documented below in the visit note. 

## 2016-08-14 NOTE — Progress Notes (Signed)
Office Note 08/14/2016  CC:  Chief Complaint  Patient presents with  . Annual Exam    Pt is not fasting.     HPI:  Ryan Dodson is a 55 y.o. male who is here for annual health maintenance exam. [facility] [place] and says it was 1.6, November this year. Last testost injection was 9 days ago, 0.4 ml is his usual dose now and he does this weekly.  Sees some white spots on back of tongue lately.  Has had URI/sinusitis lately and got a couple of rounds of antibiotics.  Taking probiotic.  Past Medical History:  Diagnosis Date  . Barrett esophagus 05/04/2012  . Elevated blood pressure reading without diagnosis of hypertension 09/23/2014  . GERD (gastroesophageal reflux disease) 04/06/2011  . Hernia, umbilical 04/06/2011  . Hypogonadism in male 02/09/2011  . Seasonal allergic rhinitis     Past Surgical History:  Procedure Laterality Date  . ESOPHAGOGASTRODUODENOSCOPY  05/14/11   Done for epigastric pain: distal esoph diverticulum, Barrett's esoph at GE jxn, hiatal hernia  . INGUINAL HERNIA REPAIR     on right, mesh in place  . VASECTOMY  2001    Family History  Problem Relation Age of Onset  . Hypotension Mother   . Arthritis Father   . Cancer Father     Social History   Social History  . Marital status: Unknown    Spouse name: N/A  . Number of children: 2  . Years of education: N/A   Occupational History  . [employer]   Social History Main Topics  . Smoking status: Never Smoker  . Smokeless tobacco: Former NeurosurgeonUser    Quit date: 08/31/1986  . Alcohol use No     Comment: socially  . Drug use: No  . Sexual activity: Yes   Other Topics Concern  . Not on file   Social History Narrative  . No narrative on file    Outpatient Medications Prior to Visit  Medication Sig Dispense Refill  . aspirin 81 MG chewable tablet Chew 81 mg by mouth.    . B-D 3CC LUER-LOK SYR 22GX1-1/2 22G X 1-1/2" 3 ML MISC 1 DOSE IM EVERY 2 WEEKS FOR TESTOSTERONE INJECTION  50 each 0  . fexofenadine (ALLEGRA) 180 MG tablet Take 180 mg by mouth daily.    Marland Kitchen. ibuprofen (ADVIL,MOTRIN) 200 MG tablet Take 200 mg by mouth every 6 (six) hours as needed.    Marland Kitchen. omeprazole (PRILOSEC OTC) 20 MG tablet Take 1 tablet by mouth daily.    . sildenafil (REVATIO) 20 MG tablet 2-3 tabs po qd prn 30 tablet 3  . testosterone cypionate (DEPOTESTOSTERONE CYPIONATE) 200 MG/ML injection INJECT 1ML INTO THE MUSCLE EVERY 14 DAYS 10 mL 1   No facility-administered medications prior to visit.     Allergies  Allergen Reactions  . Hydrocodone Other (See Comments)    Patient uuable to breathe  . Ciprofloxacin     ROS Review of Systems  Constitutional: Negative for appetite change, chills, fatigue and fever.  HENT: Negative for congestion, dental problem, ear pain and sore throat.   Eyes: Negative for discharge, redness and visual disturbance.  Respiratory: Negative for cough, chest tightness, shortness of breath and wheezing.   Cardiovascular: Negative for chest pain, palpitations and leg swelling.  Gastrointestinal: Negative for abdominal pain, blood in stool, diarrhea, nausea and vomiting.  Genitourinary: Negative for difficulty urinating, dysuria, flank pain, frequency, hematuria and urgency.  Musculoskeletal: Negative for arthralgias, back pain,  joint swelling, myalgias and neck stiffness.  Skin: Negative for pallor and rash.  Neurological: Negative for dizziness, speech difficulty, weakness and headaches.  Hematological: Negative for adenopathy. Does not bruise/bleed easily.  Psychiatric/Behavioral: Negative for confusion and sleep disturbance. The patient is not nervous/anxious.     PE; Blood pressure (!) 147/86, pulse 68, temperature 98.3 F (36.8 C), temperature source Oral, resp. rate 16, height 5' 10.5" (1.791 m), weight 188 lb 8 oz (85.5 kg), SpO2 98 %. Body mass index is 26.66 kg/m.  Gen: Alert, well appearing.  Patient is oriented to person, place, time, and  situation. AFFECT: pleasant, lucid thought and speech. ENT: Ears: EACs clear, normal epithelium.  TMs with good light reflex and landmarks bilaterally.  Eyes: no injection, icteris, swelling, or exudate.  EOMI, PERRLA. Nose: no drainage or turbinate edema/swelling.  No injection or focal lesion.  Mouth: lips without lesion/swelling.  Oral mucosa pink and moist.  Back of tongue with slight whitish discoloration but no plaque.  Dentition intact and without obvious caries or gingival swelling.  Oropharynx without erythema, exudate, or swelling.  Neck: supple/nontender.  No LAD, mass, or TM.  Carotid pulses 2+ bilaterally, without bruits. CV: RRR, no m/r/g.   LUNGS: CTA bilat, nonlabored resps, good aeration in all lung fields. ABD: soft, NT, ND, BS normal.  No hepatospenomegaly or mass.  No bruits. EXT: no clubbing, cyanosis, or edema.  Musculoskeletal: no joint swelling, erythema, warmth, or tenderness.  ROM of all joints intact. Skin - no sores or suspicious lesions or rashes or color changes Rectal exam: negative without mass, lesions or tenderness, PROSTATE EXAM: smooth and symmetric without nodules or tenderness, smooth and without nodularity but right lobe larger than left.   Pertinent labs:  Lab Results  Component Value Date   TSH 1.244 11/03/2013   Lab Results  Component Value Date   WBC 9.2 10/03/2015   HGB 16.8 10/03/2015   HCT 50.3 10/03/2015   MCV 92.5 10/03/2015   PLT 286 10/03/2015   Lab Results  Component Value Date   CREATININE 1.42 (H) 10/03/2015   BUN 19 10/03/2015   NA 135 10/03/2015   K 4.4 10/03/2015   CL 100 10/03/2015   CO2 23 10/03/2015   Lab Results  Component Value Date   ALT 38 10/03/2015   AST 33 10/03/2015   ALKPHOS 51 10/03/2015   BILITOT 0.4 10/03/2015   Lab Results  Component Value Date   CHOL 202 (H) 09/24/2014   Lab Results  Component Value Date   HDL 35.80 (L) 09/24/2014   Lab Results  Component Value Date   LDLCALC 109 (H)  11/03/2013   Lab Results  Component Value Date   TRIG 279.0 (H) 09/24/2014   Lab Results  Component Value Date   CHOLHDL 6 09/24/2014   Lab Results  Component Value Date   PSA 1.09 10/03/2015   PSA 1.37 11/03/2013   PSA 1.29 05/04/2012   Lab Results  Component Value Date   TESTOSTERONE 934 (H) 10/03/2015    ASSESSMENT AND PLAN:   No problem-specific Assessment & Plan notes found for this encounter.  Reviewed age and gender appropriate health maintenance issues (prudent diet, regular exercise, health risks of tobacco and excessive alcohol, use of seatbelts, fire alarms in home, use of sunscreen).  Also reviewed age and gender appropriate health screening as well as vaccine recommendations. Not fasting today, but drew screening TSH, CMET, and CBC. For hx of hypogonadism (on testost therapy), I drew testoterone level  today. Prostate ca screening: DRE today with asymmetry. However, his PSAs have been in low normal range and stable.   Will do repeat prostate exam and PSA in 6 mo. Colon ca screening: pt wants to check with his GI office about cost and then call me back for referral order. He is aware he also is in need of repeat EGD to f/u his hx of Barrett's esophagus from 2012.   For mild thrush currently, I sent in nystatin suspension to use 5ml qid x 14d--swish, gargle, and spit.  An After Visit Summary was printed and given to the patient.  FOLLOW UP:  Return in about 6 months (around 02/12/2017) for routine chronic illness f/u.  Signed:  Santiago Bumpers, MD           08/14/2016

## 2016-09-07 ENCOUNTER — Other Ambulatory Visit: Payer: Self-pay | Admitting: Family Medicine

## 2017-02-08 ENCOUNTER — Ambulatory Visit (INDEPENDENT_AMBULATORY_CARE_PROVIDER_SITE_OTHER): Payer: Self-pay | Admitting: Family Medicine

## 2017-02-08 ENCOUNTER — Encounter: Payer: Self-pay | Admitting: Family Medicine

## 2017-02-08 VITALS — BP 132/83 | HR 95 | Temp 98.0°F | Resp 16 | Ht 70.5 in | Wt 196.2 lb

## 2017-02-08 DIAGNOSIS — T148XXA Other injury of unspecified body region, initial encounter: Secondary | ICD-10-CM

## 2017-02-08 NOTE — Progress Notes (Signed)
OFFICE VISIT  02/08/2017   CC:  Chief Complaint  Patient presents with  . Rash    at injection site   HPI:    Patient is a 56 y.o.  male who presents for who has hypogonadism and gave himself a testosterone injection 200 mg in R leg 5 d/a, and after pulling needle out there was a gush of blood.  No continuous pulsing gush--just the one. He has a mildly swollen area at injection site, some splotchy redness.  NO pain.  No fever/malaise. The swelling has not progressed significantly  He has not applied ice.  He did not check the needle for back-flash of blood once he stuck needle in.  Past Medical History:  Diagnosis Date  . Barrett esophagus 05/04/2012  . Elevated blood pressure reading without diagnosis of hypertension 09/23/2014  . GERD (gastroesophageal reflux disease) 04/06/2011  . Hernia, umbilical 04/06/2011  . Hypogonadism in male 02/09/2011  . Seasonal allergic rhinitis     Past Surgical History:  Procedure Laterality Date  . ESOPHAGOGASTRODUODENOSCOPY  05/14/11   Done for epigastric pain: distal esoph diverticulum, Barrett's esoph at GE jxn, hiatal hernia  . INGUINAL HERNIA REPAIR     on right, mesh in place  . VASECTOMY  2001    Outpatient Medications Prior to Visit  Medication Sig Dispense Refill  . aspirin 81 MG chewable tablet Chew 81 mg by mouth.    . B-D 3CC LUER-LOK SYR 22GX1-1/2 22G X 1-1/2" 3 ML MISC 1 DOSE IM EVERY 2 WEEKS FOR TESTOSTERONE INJECTION 50 each 0  . fexofenadine (ALLEGRA) 180 MG tablet Take 180 mg by mouth daily.    Marland Kitchen. ibuprofen (ADVIL,MOTRIN) 200 MG tablet Take 200 mg by mouth every 6 (six) hours as needed.    Marland Kitchen. omeprazole (PRILOSEC OTC) 20 MG tablet Take 1 tablet by mouth daily.    . sildenafil (REVATIO) 20 MG tablet 2-3 tabs po qd prn 30 tablet 3  . testosterone cypionate (DEPOTESTOSTERONE CYPIONATE) 200 MG/ML injection INJECT 1ML INTO THE MUSCLE EVERY 14 DAYS 10 mL 1  . nystatin (MYCOSTATIN) 100000 UNIT/ML suspension TAKE 5 MLS (500,000 UNITS TOTAL)  BY MOUTH 4 (FOUR) TIMES DAILY. (Patient not taking: Reported on 02/08/2017) 250 mL 0   No facility-administered medications prior to visit.     Allergies  Allergen Reactions  . Hydrocodone Other (See Comments)    Patient uuable to breathe  . Ciprofloxacin     ROS As per HPI  PE: Blood pressure 132/83, pulse 95, temperature 98 F (36.7 C), temperature source Oral, resp. rate 16, height 5' 10.5" (1.791 m), weight 196 lb 4 oz (89 kg), SpO2 98 %. Gen: Alert, well appearing.  Patient is oriented to person, place, time, and situation. AFFECT: pleasant, lucid thought and speech. R lateral glut and lateral prox thigh with mild STS and splotchy erythema that blanches.  No tenderness or warmth. No induration.  No ecchymoses.  No pitting.  No fluctuance.  LABS:  none  IMPRESSION AND PLAN:  Right glut hematoma s/p testosterone injection. Ice needs to be applied with mild compression wrap x 30 min at least once daily x 7d. Discussed need to look for back-flash of blood into needle prior to injection testosterone EVERY TIME. Signs/symptoms to call or return for were reviewed and pt expressed understanding. Pt told to expect gradual resolution.  An After Visit Summary was printed and given to the patient.  FOLLOW UP: No Follow-up on file.  Signed:  Santiago BumpersPhil McGowen, MD  02/08/2017      

## 2017-02-12 ENCOUNTER — Other Ambulatory Visit: Payer: Self-pay | Admitting: *Deleted

## 2017-02-12 ENCOUNTER — Telehealth: Payer: Self-pay | Admitting: *Deleted

## 2017-02-12 ENCOUNTER — Ambulatory Visit (INDEPENDENT_AMBULATORY_CARE_PROVIDER_SITE_OTHER): Payer: Self-pay | Admitting: Family Medicine

## 2017-02-12 ENCOUNTER — Encounter: Payer: Self-pay | Admitting: Family Medicine

## 2017-02-12 ENCOUNTER — Ambulatory Visit: Payer: Self-pay | Admitting: Family Medicine

## 2017-02-12 VITALS — BP 159/74 | HR 81 | Temp 97.9°F | Resp 16 | Ht 70.5 in | Wt 194.5 lb

## 2017-02-12 DIAGNOSIS — Z125 Encounter for screening for malignant neoplasm of prostate: Secondary | ICD-10-CM

## 2017-02-12 DIAGNOSIS — D751 Secondary polycythemia: Secondary | ICD-10-CM

## 2017-02-12 DIAGNOSIS — E291 Testicular hypofunction: Secondary | ICD-10-CM

## 2017-02-12 DIAGNOSIS — R030 Elevated blood-pressure reading, without diagnosis of hypertension: Secondary | ICD-10-CM

## 2017-02-12 LAB — CBC WITH DIFFERENTIAL/PLATELET
BASOS ABS: 0.1 10*3/uL (ref 0.0–0.1)
Basophils Relative: 0.9 % (ref 0.0–3.0)
EOS ABS: 1.1 10*3/uL — AB (ref 0.0–0.7)
Eosinophils Relative: 9 % — ABNORMAL HIGH (ref 0.0–5.0)
Hemoglobin: 18.7 g/dL (ref 13.0–17.0)
LYMPHS ABS: 1.9 10*3/uL (ref 0.7–4.0)
LYMPHS PCT: 15.4 % (ref 12.0–46.0)
MCHC: 33.1 g/dL (ref 30.0–36.0)
MCV: 93.4 fl (ref 78.0–100.0)
MONO ABS: 1.1 10*3/uL — AB (ref 0.1–1.0)
Monocytes Relative: 9 % (ref 3.0–12.0)
NEUTROS ABS: 8.2 10*3/uL — AB (ref 1.4–7.7)
NEUTROS PCT: 65.7 % (ref 43.0–77.0)
PLATELETS: 298 10*3/uL (ref 150.0–400.0)
RBC: 6.04 Mil/uL — ABNORMAL HIGH (ref 4.22–5.81)
RDW: 14.4 % (ref 11.5–15.5)
WBC: 12.5 10*3/uL — ABNORMAL HIGH (ref 4.0–10.5)

## 2017-02-12 LAB — PSA: PSA: 2.17 ng/mL (ref 0.10–4.00)

## 2017-02-12 MED ORDER — TESTOSTERONE CYPIONATE 200 MG/ML IM SOLN: INTRAMUSCULAR | 1 refills | Status: DC

## 2017-02-12 NOTE — Telephone Encounter (Signed)
Critical lab HGB 18.7 reported by Colgate PalmoliveShaneeqah Leader from the lab. Reaped and affirmed results. Results forwarded to Dr Milinda CaveMcGowen.

## 2017-02-12 NOTE — Telephone Encounter (Signed)
Noted. I sent a result note to Herbert SetaHeather about this already.

## 2017-02-12 NOTE — Progress Notes (Signed)
OFFICE VISIT  02/12/2017   CC:  Chief Complaint  Patient presents with  . Follow-up    RCI, pt is fasting.      HPI:    Patient is a 56 y.o.  male who presents for 4 mo f/u male hypogonadism and hx of elevated bp w/out dx of HTN. Doing well on the testosterone: feels great.  He changed his dosing to 0.4 ml weekly.  Home bp monitoring has always been <130/80.  He is due for prostate ca screening: DRE and PSA. He has been seen for R sided asymmetry by urologist in not-to-distant past and was reassured, no further w/u recommended.  Past Medical History:  Diagnosis Date  . Barrett esophagus 05/04/2012  . Elevated blood pressure reading without diagnosis of hypertension 09/23/2014  . GERD (gastroesophageal reflux disease) 04/06/2011  . Hernia, umbilical 04/06/2011  . Hypogonadism in male 02/09/2011  . 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  . Seasonal allergic rhinitis     Past Surgical History:  Procedure Laterality Date  . ESOPHAGOGASTRODUODENOSCOPY  05/14/11   Done for epigastric pain: distal esoph diverticulum, Barrett's esoph at GE jxn, hiatal hernia  . INGUINAL HERNIA REPAIR     on right, mesh in place  . VASECTOMY  2001    Outpatient Medications Prior to Visit  Medication Sig Dispense Refill  . aspirin 81 MG chewable tablet Chew 81 mg by mouth.    . B-D 3CC LUER-LOK SYR 22GX1-1/2 22G X 1-1/2" 3 ML MISC 1 DOSE IM EVERY 2 WEEKS FOR TESTOSTERONE INJECTION 50 each 0  . fexofenadine (ALLEGRA) 180 MG tablet Take 180 mg by mouth daily.    Marland Kitchen ibuprofen (ADVIL,MOTRIN) 200 MG tablet Take 200 mg by mouth every 6 (six) hours as needed.    Marland Kitchen omeprazole (PRILOSEC OTC) 20 MG tablet Take 1 tablet by mouth daily.    . sildenafil (REVATIO) 20 MG tablet 2-3 tabs po qd prn 30 tablet 3  . testosterone cypionate (DEPOTESTOSTERONE CYPIONATE) 200 MG/ML injection INJECT INTO THE MUSCLE EVERY 14 DAYS 10 mL 1   No  facility-administered medications prior to visit.     Allergies  Allergen Reactions  . Hydrocodone Other (See Comments)    Patient uuable to breathe  . Ciprofloxacin     ROS As per HPI  PE: Blood pressure (!) 159/74, pulse 81, temperature 97.9 F (36.6 C), temperature source Oral, resp. rate 16, height 5' 10.5" (1.791 m), weight 194 lb 8 oz (88.2 kg), SpO2 97 %. Gen: Alert, well appearing.  Patient is oriented to person, place, time, and situation. AFFECT: pleasant, lucid thought and speech. Rectal exam: negative without mass, lesions or tenderness, PROSTATE EXAM: smooth and symmetric without nodules or tenderness.  Prostate is smooth and without nodule or tenderness.  His R lobe is significantly larger than L lobe diffusely--same as prior exams.   LABS:  Lab Results  Component Value Date   TSH 1.67 08/14/2016   Lab Results  Component Value Date   WBC 8.5 08/14/2016   HGB 17.0 08/14/2016   HCT 50.6 08/14/2016   MCV 92.6 08/14/2016   PLT 336.0 08/14/2016   Lab Results  Component Value Date   CREATININE 1.07 08/14/2016   BUN 20 08/14/2016   NA 138 08/14/2016   K 4.7 08/14/2016   CL 98 08/14/2016   CO2 32 08/14/2016   Lab Results  Component Value Date   ALT  69 (H) 08/14/2016   AST 31 08/14/2016   ALKPHOS 53 08/14/2016   BILITOT 0.4 08/14/2016   Lab Results  Component Value Date   CHOL 202 (H) 09/24/2014   Lab Results  Component Value Date   HDL 35.80 (L) 09/24/2014   Lab Results  Component Value Date   LDLCALC 109 (H) 11/03/2013   Lab Results  Component Value Date   TRIG 279.0 (H) 09/24/2014   Lab Results  Component Value Date   CHOLHDL 6 09/24/2014   Lab Results  Component Value Date   PSA 1.09 10/03/2015   PSA 1.37 11/03/2013   PSA 1.29 05/04/2012   Lab Results  Component Value Date   TESTOSTERONE 375.41 08/14/2016   IMPRESSION AND PLAN:  1) Hypogonadism: doing well on current dosing of testosterone. Follow CBC today as well as  PSA.  2) Prostate ca screening: he has chronic R sided enlargement/asymmetry--no change. Check PSA today.  An After Visit Summary was printed and given to the patient.  FOLLOW UP: Return in about 6 months (around 08/14/2017) for annual CPE (fasting).  Signed:  Santiago BumpersPhil McGowen, MD           02/12/2017

## 2017-04-21 ENCOUNTER — Ambulatory Visit: Payer: Self-pay

## 2017-04-21 ENCOUNTER — Other Ambulatory Visit (INDEPENDENT_AMBULATORY_CARE_PROVIDER_SITE_OTHER): Payer: PRIVATE HEALTH INSURANCE

## 2017-04-21 DIAGNOSIS — D751 Secondary polycythemia: Secondary | ICD-10-CM

## 2017-04-22 LAB — CBC WITH DIFFERENTIAL/PLATELET
Basophils Absolute: 0 10*3/uL (ref 0.0–0.1)
Basophils Relative: 0.4 % (ref 0.0–3.0)
EOS ABS: 0.3 10*3/uL (ref 0.0–0.7)
Eosinophils Relative: 3 % (ref 0.0–5.0)
HEMATOCRIT: 52.8 % — AB (ref 39.0–52.0)
HEMOGLOBIN: 17.1 g/dL — AB (ref 13.0–17.0)
LYMPHS PCT: 14.4 % (ref 12.0–46.0)
Lymphs Abs: 1.5 10*3/uL (ref 0.7–4.0)
MCHC: 32.3 g/dL (ref 30.0–36.0)
MCV: 94.6 fl (ref 78.0–100.0)
MONO ABS: 1.7 10*3/uL — AB (ref 0.1–1.0)
Monocytes Relative: 15.6 % — ABNORMAL HIGH (ref 3.0–12.0)
NEUTROS PCT: 66.6 % (ref 43.0–77.0)
Neutro Abs: 7.1 10*3/uL (ref 1.4–7.7)
PLATELETS: 335 10*3/uL (ref 150.0–400.0)
RBC: 5.58 Mil/uL (ref 4.22–5.81)
RDW: 14.7 % (ref 11.5–15.5)
WBC: 10.7 10*3/uL — AB (ref 4.0–10.5)

## 2017-04-23 ENCOUNTER — Telehealth: Payer: Self-pay | Admitting: Family Medicine

## 2017-04-23 ENCOUNTER — Encounter: Payer: Self-pay | Admitting: Family Medicine

## 2017-04-23 NOTE — Telephone Encounter (Signed)
Patient requesting call back with lab results.

## 2017-04-23 NOTE — Telephone Encounter (Signed)
Result note sent

## 2017-04-23 NOTE — Telephone Encounter (Signed)
Please advise. Thanks.  

## 2017-05-04 ENCOUNTER — Telehealth: Payer: Self-pay | Admitting: *Deleted

## 2017-05-04 NOTE — Telephone Encounter (Signed)
Pt called stating that [facility] only gave him 6ml of his testosterone. Pt stated that he is out and will be due for his next injection next week. He wanted to know if Dr. Milinda CaveMcGowen would send in a Rx for 1ml to get him by til the pharmacy will refill his testosterone on 05/22/17. I advised pt that I will need to speak to the pharmacist and see why they did not fill Rx for 10ml as prescribed. Pt voiced understanding.   [facility], she stated that it was a mistake on their end and they will go ahead and refill Rx for 10ml as directed.   Pt advised and voiced understanding.

## 2017-05-06 ENCOUNTER — Telehealth: Payer: Self-pay | Admitting: Family Medicine

## 2017-05-06 MED ORDER — SILDENAFIL CITRATE 20 MG PO TABS: ORAL_TABLET | ORAL | 3 refills | Status: DC

## 2017-05-06 NOTE — Telephone Encounter (Signed)
Patient requesting refill of sildenafil (REVATIO) 20 MG tablet #30.  [id number] [place], [place] - [address] [phone] (Phone) [phone] (Fax)

## 2017-05-06 NOTE — Telephone Encounter (Signed)
Rx sent.  Pt advised and voiced understanding.   

## 2017-08-03 ENCOUNTER — Other Ambulatory Visit: Payer: Self-pay | Admitting: *Deleted

## 2017-08-03 MED ORDER — TESTOSTERONE CYPIONATE 200 MG/ML IM SOLN: INTRAMUSCULAR | 0 refills | Status: DC

## 2017-08-03 NOTE — Telephone Encounter (Signed)
[facility]  I think this may be an early request, not sure.   RF request for testosterone LOV: 02/12/17 Next ov: 11/12/17 Last written: 02/12/17 #[id number] w/ 1RF  Please advise. Thanks.

## 2017-08-04 NOTE — Telephone Encounter (Signed)
Rx faxed

## 2017-09-13 ENCOUNTER — Telehealth: Payer: Self-pay | Admitting: Family Medicine

## 2017-09-13 ENCOUNTER — Encounter: Payer: Self-pay | Admitting: Family Medicine

## 2017-09-13 NOTE — Telephone Encounter (Signed)
I called [facility] and gave verbal orders for this.  I notified pt and he will go today and in 3d for phlebotomy. I still need to print out the paper order form from his MyChart note and fax it to them.  -Thanks again for notifying me of this problem!

## 2017-09-13 NOTE — Telephone Encounter (Signed)
Please advise 

## 2017-09-13 NOTE — Telephone Encounter (Signed)
Pt's Hb 20.5 at a blood donation center and they would not draw his blood.  He has testosterone-induced polycythemia and at admits he has not carried out routine blood donations as he has been instructed.  Today I have arranged (by phone/verbal order) for him to [facility] [place].  He will also get another phlebotomy in 3d at same location.  If Hb is 13.5 or less they will not phlebotomize him.

## 2017-09-13 NOTE — Telephone Encounter (Signed)
Ryan Dodson, I took care of this/talked with pt on phone today, etc. Can you just close this MyChart note now? -thx

## 2017-09-13 NOTE — Telephone Encounter (Signed)
Copied from CRM [phone]. Topic: General - Other >> Sep 13, 2017 10:17 AM Gerrianne ScalePayne, Karuna Balducci L wrote: Reason for CRM: patient states that he need a RX for Phlebotomy 1 pint blood because he has elevated RBC and the [facility] want do it unless he has a RX for it  he has a 20.4 hemoglobin also

## 2017-09-21 ENCOUNTER — Encounter: Payer: Self-pay | Admitting: Family Medicine

## 2017-09-21 ENCOUNTER — Ambulatory Visit (INDEPENDENT_AMBULATORY_CARE_PROVIDER_SITE_OTHER): Payer: PRIVATE HEALTH INSURANCE | Admitting: Family Medicine

## 2017-09-21 ENCOUNTER — Ambulatory Visit: Payer: Self-pay | Admitting: *Deleted

## 2017-09-21 VITALS — BP 149/79 | HR 99 | Temp 97.9°F | Wt 220.4 lb

## 2017-09-21 DIAGNOSIS — R0683 Snoring: Secondary | ICD-10-CM

## 2017-09-21 DIAGNOSIS — R030 Elevated blood-pressure reading, without diagnosis of hypertension: Secondary | ICD-10-CM

## 2017-09-21 DIAGNOSIS — R0681 Apnea, not elsewhere classified: Secondary | ICD-10-CM | POA: Diagnosis not present

## 2017-09-21 NOTE — Progress Notes (Signed)
Ryan Dodson , 11-27-1960, 57 y.o., male MRN: [id number] Patient Care Team    Relationship Specialty Notifications Start End  McGowen, Maryjean MornPhilip H, MD PCP - General Family Medicine  03/06/15    Comment: [facility]    Chief Complaint  Patient presents with  . Sleep Apnea    pt c/o of sleep apnea that he noticed 10 days ago, says he was finding breath after waking, said his sensation is he can't get enough oxgen.     Subjective: Pt presents for an OV with complaints of witnessed apneic spells and snoring. Patient states he noticed symptoms onset going on for about a year. His significant other reports over the last 2 weeks he has been snoring more, and witnessing apneic spells. He states about 10 days ago and last night he woke himself up by snoring. He states he got up and felt dizzy, and then felt like he "could not get enough air" for about 1 hour, then it self resolved. He states he is worried he needs a cardiac stent ovaries having heart problems. He denies any chest pain, dizziness besides the one episode, edema, shortness of breath that occurs at any other time during the day besides sleeping. He exercises daily without any symptoms. He reports exercising makes him feel better. 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 Her recent lab work available since August 2018, but he states he had labs drawn recently and is awaiting results. He reports having to give blood 2 times in order to get his counts to normal. He takes baby aspirin daily. He is concerned he might have a blood clot. He has a history of borderline hypertension without diagnosis of hypertension. His BMI is greater than 30.  No flowsheet data found.  Allergies  Allergen Reactions  . Hydrocodone Other (See Comments)    Patient uuable to breathe  . Ciprofloxacin    Social History   Tobacco Use  . Smoking status: Never Smoker  . Smokeless tobacco: Former  Engineer, waterUser  Substance Use Topics  . Alcohol use: No    Alcohol/week: 0.0 oz    Comment: socially   Past Medical History:  Diagnosis Date  . Barrett esophagus 05/04/2012  . Elevated blood pressure reading without diagnosis of hypertension 09/23/2014  . GERD (gastroesophageal reflux disease) 04/06/2011  . Hernia, umbilical 04/06/2011  . Hypogonadism in male 02/09/2011  . 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  . Seasonal allergic rhinitis   . 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 ESOPHAGOGASTRODUODENOSCOPY  05/14/11   Done for epigastric pain: distal esoph diverticulum, Barrett's esoph at GE jxn, hiatal hernia  . INGUINAL HERNIA REPAIR     on right, mesh in place  . VASECTOMY  2001   Family History  Problem Relation Age of Onset  . Hypotension Mother   . Arthritis Father   . Cancer Father    Allergies as of 09/21/2017      Reactions   Hydrocodone Other (See Comments)   Patient uuable to breathe   Ciprofloxacin       Medication List        Accurate as of 09/21/17  6:31 PM. Always use your most recent med list.          aspirin 81 MG chewable tablet  Chew 81 mg by mouth.   B-D 3CC LUER-LOK SYR 22GX1-1/2 22G X 1-1/2" 3 ML Misc Generic drug:  SYRINGE-NEEDLE (DISP) 3 ML 1 DOSE IM EVERY 2 WEEKS FOR TESTOSTERONE INJECTION   fexofenadine 180 MG tablet Commonly known as:  ALLEGRA Take 180 mg by mouth daily.   ibuprofen 200 MG tablet Commonly known as:  ADVIL,MOTRIN Take 200 mg by mouth every 6 (six) hours as needed.   omeprazole 20 MG tablet Commonly known as:  PRILOSEC OTC Take 1 tablet by mouth daily.   sildenafil 20 MG tablet Commonly known as:  REVATIO 2-3 tabs po qd prn   testosterone cypionate 200 MG/ML injection Commonly known as:  DEPOTESTOSTERONE CYPIONATE INJECT INTO THE MUSCLE EVERY 14 DAYS       All past  medical history, surgical history, allergies, family history, immunizations andmedications were updated in the EMR today and reviewed under the history and medication portions of their EMR.     ROS: Negative, with the exception of above mentioned in HPI   Objective:  BP (!) 149/79 (BP Location: Right Arm, Patient Position: Sitting, Cuff Size: Large)   Pulse 99   Temp 97.9 F (36.6 C) (Oral)   Wt 220 lb 6.4 oz (100 kg)   SpO2 97%   BMI 31.18 kg/m  Body mass index is 31.18 kg/m. Gen: Afebrile. No acute distress. Nontoxic in appearance, well developed, well nourished. Appears mildly nervous today. HENT: AT. [place]. Bilateral TM visualized without erythema or fullness. MMM, no oral lesions. Small oropharynx. Bilateral nares without erythema, drainage or swelling. Throat without erythema or exudates. No cough, no hoarseness, no sinus pressure. Eyes:Pupils Equal Round Reactive to light, Extraocular movements intact,  Conjunctiva without redness, discharge or icterus. Neck/lymp/endocrine: Supple, thick neck line. CV: RRR no murmur, no edema Chest: CTAB, no wheeze or crackles. Good air movement, normal resp effort.  Neuro:  Normal gait. PERLA. EOMi. Alert. Oriented x3   No exam data present No results found. No results found for this or any previous visit (from the past 24 hour(s)).  Assessment/Plan: Ryan Dodson is a 57 y.o. male present for OV for  Apneic episode/Snoring - VSS, PO2 97% on room air. Patient appears mildly anxious. He was reassured this is likely not cardiac, given he exercises rigorously every day without any symptoms. Reassured him PE would not only have symptoms at nighttime.  - Patient is at high risk for sleep apnea by screening: BMI above 30, male, snoring, witnessed apnea spells, elevated BP, small oropharynx, greater than 29 years of age. Discussed referral to pulmonology to have evaluation and they will manage sleep study and see CPap if needed. - As far as his  secondary polycythemia history, I do not have any of his recent labs or history to visualize. I would recommend he follow-up with his PCP after he gets the results is waiting on. Continue daily baby aspirin. - Ambulatory referral to Pulmonology  Elevated blood-pressure reading without diagnosis of hypertension Patient has been borderline hypertensive throughout the past. He is rather anxious today likely affecting his blood pressure. She does indeed have sleep apnea, this also could be affecting his blood pressure. Patient encouraged to follow-up with PCP    Reviewed expectations re: course of current medical issues.  Discussed self-management of symptoms.  Outlined signs and symptoms indicating need for more acute intervention.  Patient verbalized understanding and all questions were answered.  Patient received an After-Visit Summary.    Orders Placed This Encounter  Procedures  . Ambulatory referral to Pulmonology     Note is dictated utilizing voice recognition software. Although note has been proof read prior to signing, occasional typographical errors still can be missed. If any questions arise, please do not hesitate to call for verification.   electronically signed by:  Felix Pacini, DO  [facility] [place]

## 2017-09-21 NOTE — Patient Instructions (Signed)

## 2017-09-21 NOTE — Telephone Encounter (Signed)
Pt called because he has had sleep apnea last night and 6 days ago.  He panics when he wakes up because he is short of breathe. Appointment made for today. Advised to call back if he has difficultly breathing before his appointment. Pt voiced understanding. No protocol found.   Answer Assessment - Initial Assessment Questions 1. RESPIRATORY STATUS: "Describe your breathing?" (e.g., wheezing, shortness of breath, unable to speak, severe coughing)      Feels short of breathe after the episode of stopping breathing when sleeping 2. ONSET: "When did this breathing problem begin?"      6 days ago 3. PATTERN "Does the difficult breathing come and go, or has it been constant since it started?"      Some shortness of breathe after about an hour after waking up 4. SEVERITY: "How bad is your breathing?" (e.g., mild, moderate, severe)    - MILD: No SOB at rest, mild SOB with walking, speaks normally in sentences, can lay down, no retractions, pulse < 100.    - MODERATE: SOB at rest, SOB with minimal exertion and prefers to sit, cannot lie down flat, speaks in phrases, mild retractions, audible wheezing, pulse 100-120.    - SEVERE: Very SOB at rest, speaks in single words, struggling to breathe, sitting hunched forward, retractions, pulse > 120      moderate 5. RECURRENT SYMPTOM: "Have you had difficulty breathing before?" If so, ask: "When was the last time?" and "What happened that time?"      The first time was 6 days ago 6. CARDIAC HISTORY: "Do you have any history of heart disease?" (e.g., heart attack, angina, bypass surgery, angioplasty)      no 7. LUNG HISTORY: "Do you have any history of lung disease?"  (e.g., pulmonary embolus, asthma, emphysema)     no 8. CAUSE: "What do you think is causing the breathing problem?"      Not sure what is causing this 9. OTHER SYMPTOMS: "Do you have any other symptoms? (e.g., dizziness, runny nose, cough, chest pain, fever)     Dizziness when wake up 10.  PREGNANCY: "Is there any chance you are pregnant?" "When was your last menstrual period?"       n/a 11. TRAVEL: "Have you traveled out of the country in the last month?" (e.g., travel history, exposures)       no  Protocols used: BREATHING DIFFICULTY-A-AH

## 2017-09-22 ENCOUNTER — Ambulatory Visit: Payer: PRIVATE HEALTH INSURANCE | Admitting: Family Medicine

## 2017-09-22 ENCOUNTER — Encounter: Payer: Self-pay | Admitting: Family Medicine

## 2017-09-23 ENCOUNTER — Encounter: Payer: Self-pay | Admitting: Family Medicine

## 2017-09-23 NOTE — Telephone Encounter (Signed)
Noted/agree.-thx

## 2017-09-27 ENCOUNTER — Telehealth: Payer: Self-pay | Admitting: Family Medicine

## 2017-09-27 NOTE — Telephone Encounter (Signed)
FYI

## 2017-09-27 NOTE — Telephone Encounter (Signed)
[facility] talked to [facility]. They said the study would be $1,300 and that his insurance will not cover it. Patient said he found an online home sleep study for $200 that he is thinking about doing. He also said he found a place in [place] that is a lot cheaper so he may fly there and get it done there. Patient is not requesting a CB just wanted let PCP know how expensive it is.

## 2017-10-07 ENCOUNTER — Telehealth: Payer: Self-pay | Admitting: *Deleted

## 2017-10-07 DIAGNOSIS — D751 Secondary polycythemia: Secondary | ICD-10-CM

## 2017-10-07 DIAGNOSIS — E291 Testicular hypofunction: Secondary | ICD-10-CM

## 2017-10-07 NOTE — Telephone Encounter (Signed)
OK, orders are in. I need to know when his most recent testosterone injection was so I can interpret his testosterone level appropriately.-thx

## 2017-10-07 NOTE — Telephone Encounter (Signed)
Okay to order labs? Please advise. Thanks .

## 2017-10-07 NOTE — Telephone Encounter (Signed)
Pt advised and voiced understanding. He stated that his last testosterone injection was 10/01/17.

## 2017-10-07 NOTE — Telephone Encounter (Signed)
Copied from CRM [phone]. Topic: Inquiry >> Oct 07, 2017  2:54 PM Maia Pettiesrtiz, Kristie S wrote: Reason for CRM: pt wanting to come into the office tomorrow 10/07/17 for lab draw to recheck hemoglobin and testosterone - no orders in system - please advise

## 2017-10-07 NOTE — Telephone Encounter (Signed)
Noted: most recent testosterone injection was 10/01/17.

## 2017-10-08 ENCOUNTER — Other Ambulatory Visit (INDEPENDENT_AMBULATORY_CARE_PROVIDER_SITE_OTHER): Payer: PRIVATE HEALTH INSURANCE

## 2017-10-08 DIAGNOSIS — D751 Secondary polycythemia: Secondary | ICD-10-CM

## 2017-10-08 DIAGNOSIS — E291 Testicular hypofunction: Secondary | ICD-10-CM

## 2017-10-08 LAB — TESTOSTERONE: Testosterone: 574.03 ng/dL (ref 300.00–890.00)

## 2017-10-08 NOTE — Addendum Note (Signed)
Addended by: Eulah PontALBRIGHT, Keiden Deskin M on: 10/08/2017 11:43 AM   Modules accepted: Orders

## 2017-10-09 LAB — CBC WITH DIFFERENTIAL/PLATELET
BASOS ABS: 70 {cells}/uL (ref 0–200)
Basophils Relative: 0.9 %
Eosinophils Absolute: 187 cells/uL (ref 15–500)
Eosinophils Relative: 2.4 %
HEMATOCRIT: 52.9 % — AB (ref 38.5–50.0)
Hemoglobin: 17.8 g/dL — ABNORMAL HIGH (ref 13.2–17.1)
LYMPHS ABS: 1552 {cells}/uL (ref 850–3900)
MCH: 30 pg (ref 27.0–33.0)
MCHC: 33.6 g/dL (ref 32.0–36.0)
MCV: 89.1 fL (ref 80.0–100.0)
MPV: 11.3 fL (ref 7.5–12.5)
Monocytes Relative: 13.4 %
NEUTROS PCT: 63.4 %
Neutro Abs: 4945 cells/uL (ref 1500–7800)
PLATELETS: 339 10*3/uL (ref 140–400)
RBC: 5.94 10*6/uL — AB (ref 4.20–5.80)
RDW: 12.8 % (ref 11.0–15.0)
TOTAL LYMPHOCYTE: 19.9 %
WBC mixed population: 1045 cells/uL — ABNORMAL HIGH (ref 200–950)
WBC: 7.8 10*3/uL (ref 3.8–10.8)

## 2017-10-11 ENCOUNTER — Other Ambulatory Visit: Payer: Self-pay

## 2017-10-11 DIAGNOSIS — E291 Testicular hypofunction: Secondary | ICD-10-CM

## 2017-11-05 ENCOUNTER — Encounter: Payer: Self-pay | Admitting: *Deleted

## 2017-11-08 ENCOUNTER — Encounter: Payer: Self-pay | Admitting: Family Medicine

## 2017-11-08 NOTE — Progress Notes (Deleted)
Office Note 11/08/2017  CC: No chief complaint on file.   HPI:  Ryan Dodson is a 57 y.o.  male who is here for annual health maintenance exam.   Past Medical History:  Diagnosis Date  . Barrett esophagus 05/04/2012  . Elevated blood pressure reading without diagnosis of hypertension 09/23/2014  . GERD (gastroesophageal reflux disease) 04/06/2011  . Hernia, umbilical 04/06/2011  . Hypogonadism in male 02/09/2011  . OSA (obstructive sleep apnea)    suspected: referred to pulm/sleep med by Dr. Claiborne BillingsKuneff 08/2017.  Marland Kitchen. 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  . Seasonal allergic rhinitis   . 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 ESOPHAGOGASTRODUODENOSCOPY  05/14/11   Done for epigastric pain: distal esoph diverticulum, Barrett's esoph at GE jxn, hiatal hernia  . INGUINAL HERNIA REPAIR     on right, mesh in place  . VASECTOMY  2001    Family History  Problem Relation Age of Onset  . Hypotension Mother   . Arthritis Father   . Cancer Father     Social History   Socioeconomic History  . Marital status: Unknown    Spouse name: Not on file  . Number of children: 2  . Years of education: Not on file  . Highest education level: Not on file  Social Needs  . Financial resource strain: Not on file  . Food insecurity - worry: Not on file  . Food insecurity - inability: Not on file  . Transportation needs - medical: Not on file  . Transportation needs - non-medical: Not on file  Occupational History  . Occupation: [employer]    Employer: [employer]  Tobacco Use  . Smoking status: Never Smoker  . Smokeless tobacco: Former Engineer, waterUser  Substance and Sexual Activity  . Alcohol use: No    Alcohol/week: 0.0 oz    Comment: socially  . Drug use: No  . Sexual activity: Yes  Other Topics Concern  . Not on file  Social History Narrative  . Not on  file    Outpatient Medications Prior to Visit  Medication Sig Dispense Refill  . aspirin 81 MG chewable tablet Chew 81 mg by mouth.    . B-D 3CC LUER-LOK SYR 22GX1-1/2 22G X 1-1/2" 3 ML MISC 1 DOSE IM EVERY 2 WEEKS FOR TESTOSTERONE INJECTION 50 each 0  . fexofenadine (ALLEGRA) 180 MG tablet Take 180 mg by mouth daily.    Marland Kitchen. ibuprofen (ADVIL,MOTRIN) 200 MG tablet Take 200 mg by mouth every 6 (six) hours as needed.    Marland Kitchen. omeprazole (PRILOSEC OTC) 20 MG tablet Take 1 tablet by mouth daily.    . sildenafil (REVATIO) 20 MG tablet 2-3 tabs po qd prn 30 tablet 3  . testosterone cypionate (DEPOTESTOSTERONE CYPIONATE) 200 MG/ML injection INJECT 1ML INTO THE MUSCLE EVERY 14 DAYS 10 mL 0   No facility-administered medications prior to visit.     Allergies  Allergen Reactions  . Hydrocodone Other (See Comments)    Patient uuable to breathe  . Ciprofloxacin   . Clavulanic Acid Rash    ROS *** PE; There were no vitals taken for this visit. *** Pertinent labs:  Lab Results  Component Value Date   TSH 1.67 08/14/2016   Lab Results  Component Value Date   WBC 7.8 10/08/2017   HGB 17.8 (H) 10/08/2017   HCT  52.9 (H) 10/08/2017   MCV 89.1 10/08/2017   PLT 339 10/08/2017   Lab Results  Component Value Date   CREATININE 1.07 08/14/2016   BUN 20 08/14/2016   NA 138 08/14/2016   K 4.7 08/14/2016   CL 98 08/14/2016   CO2 32 08/14/2016   Lab Results  Component Value Date   ALT 69 (H) 08/14/2016   AST 31 08/14/2016   ALKPHOS 53 08/14/2016   BILITOT 0.4 08/14/2016   Lab Results  Component Value Date   CHOL 202 (H) 09/24/2014   Lab Results  Component Value Date   HDL 35.80 (L) 09/24/2014   Lab Results  Component Value Date   LDLCALC 109 (H) 11/03/2013   Lab Results  Component Value Date   TRIG 279.0 (H) 09/24/2014   Lab Results  Component Value Date   CHOLHDL 6 09/24/2014   Lab Results  Component Value Date   PSA 2.17 02/12/2017   PSA 1.09 10/03/2015   PSA 1.37  11/03/2013    ASSESSMENT AND PLAN:   Health maintenance exam: Reviewed age and gender appropriate health maintenance issues (prudent diet, regular exercise, health risks of tobacco and excessive alcohol, use of seatbelts, fire alarms in home, use of sunscreen).  Also reviewed age and gender appropriate health screening as well as vaccine recommendations. Vaccines: Tdap---    Flu--- Labs: CBC, CMET, TSH, FLP. Prostate ca screening: PSA normal 01/2017, prostate asymmetry evaluated by Urol, reassurance given.  Repeat PSA after 01/2018. Colon ca screening: needs screening colonoscopy--  An After Visit Summary was printed and given to the patient.   FOLLOW UP:  No Follow-up on file.  Signed:  Santiago Bumpers, MD           11/08/2017

## 2017-11-12 ENCOUNTER — Encounter: Payer: Self-pay | Admitting: Family Medicine

## 2017-11-22 ENCOUNTER — Other Ambulatory Visit: Payer: Self-pay | Admitting: *Deleted

## 2017-11-22 MED ORDER — TESTOSTERONE CYPIONATE 200 MG/ML IM SOLN: INTRAMUSCULAR | 0 refills | Status: DC

## 2017-11-22 NOTE — Telephone Encounter (Signed)
Rx faxed.  Tried calling pt, NA, phone line busy.

## 2017-11-22 NOTE — Telephone Encounter (Signed)
[facility]  RF request for testosterone LOV: 02/12/17 Next ov: None Last written: 08/03/17 #3210ml w/ 0RF  Please advise. Thanks.

## 2017-11-22 NOTE — Telephone Encounter (Signed)
Rx printed. Needs o/v for f/u low testosterone in 4 mo. Pls make sure pt is donating blood every 3 mo.-thx

## 2017-11-23 NOTE — Telephone Encounter (Signed)
Tried calling pt NA, unable to leave a message - call unable to be completed from the country I was calling (message at end of call).

## 2018-01-03 ENCOUNTER — Other Ambulatory Visit: Payer: Self-pay | Admitting: Family Medicine

## 2018-01-03 ENCOUNTER — Telehealth: Payer: Self-pay | Admitting: *Deleted

## 2018-01-03 MED ORDER — NYSTATIN 100000 UNIT/ML MT SUSP: 5.0000 mL | Freq: Four times a day (QID) | OROMUCOSAL | 0 refills | Status: DC

## 2018-01-03 NOTE — Telephone Encounter (Signed)
Nystatin suspension eRx'd today.

## 2018-01-03 NOTE — Telephone Encounter (Signed)
Please advise. Thanks.  

## 2018-01-03 NOTE — Telephone Encounter (Signed)
Copied from CRM [id number]. Topic: Inquiry >> Jan 03, 2018  1:15 PM Yvonna Alanis wrote: Reason for CRM: Patient has requested if Dr. Milinda Cave would prescribe to him the medication Nystatin. Patient has had the medication in the past, and states that he needs it again now. Patient does not want to come in for an appt. Please call patient.     Thank You!!!

## 2018-01-04 NOTE — Telephone Encounter (Signed)
Pt advised and voiced understanding.   

## 2018-01-18 ENCOUNTER — Encounter: Payer: Self-pay | Admitting: Family Medicine

## 2018-01-18 ENCOUNTER — Telehealth: Payer: Self-pay | Admitting: Family Medicine

## 2018-01-18 NOTE — Telephone Encounter (Signed)
Copied from CRM [id number]. Topic: Quick Communication - See Telephone Encounter >> Jan 18, 2018 12:46 PM Rudi Coco, NT wrote: CRM for notification. See Telephone encounter for: 01/18/18.  Pt. Calling doesn't have insurance but wanting to see if Dr. Milinda Cave would refer him to to a provider to check him about his prostate issues. Pt. Doesn't want to come in to for an appt. Dr. Milinda Cave due to not having insurance

## 2018-01-18 NOTE — Telephone Encounter (Signed)
I can see him for this problem.  To be seen by a specialist is going to cost him even more money. If he still prefers specialist referral then let me know and I'll place the order.-thx

## 2018-01-18 NOTE — Telephone Encounter (Signed)
I can do this, but I need to know the exact reason for the referral: is it for his asymmetric enlargement of prostate gland that he has seen urologist before in the past?  If not, ask what specific symptoms he means when he says "prostate issues".  Let me know.

## 2018-01-19 NOTE — Telephone Encounter (Signed)
Noted  

## 2018-01-19 NOTE — Telephone Encounter (Signed)
Message left on voice mail for patient to return call. 

## 2018-01-19 NOTE — Telephone Encounter (Signed)
Please advise. Thanks.  

## 2018-01-19 NOTE — Telephone Encounter (Signed)
Patient notified and verbalized understanding but has decided to wait and see if symptoms bet better. He is starting to feel some better. He will call back next week or sooner if he needs an appointment.

## 2018-02-18 ENCOUNTER — Ambulatory Visit (INDEPENDENT_AMBULATORY_CARE_PROVIDER_SITE_OTHER): Payer: PRIVATE HEALTH INSURANCE | Admitting: Family Medicine

## 2018-02-18 ENCOUNTER — Encounter: Payer: Self-pay | Admitting: Family Medicine

## 2018-02-18 VITALS — BP 136/84 | HR 77 | Temp 98.2°F | Resp 16 | Ht 70.5 in | Wt 196.2 lb

## 2018-02-18 DIAGNOSIS — H1031 Unspecified acute conjunctivitis, right eye: Secondary | ICD-10-CM

## 2018-02-18 MED ORDER — POLYMYXIN B-TRIMETHOPRIM 10000-0.1 UNIT/ML-% OP SOLN: 2.0000 [drp] | Freq: Four times a day (QID) | OPHTHALMIC | 0 refills | Status: AC

## 2018-02-18 NOTE — Progress Notes (Signed)
OFFICE VISIT  02/18/2018   CC:  Chief Complaint  Patient presents with  . Pink Eye    ? right eye    HPI:    Patient is a 57 y.o.  male who presents for right eye redness. Onset about 1 mo ago, then cleared up some. Then 3-4 days ago he noted redness come again, itchy, burning, weeping clear fluid as well.  A little blurry in R eye. No pain.  Sx's a bit worse hs---more seapage, crust in eye. Visiene has been used. No sick contacts.  Left eye feels normal.  Past Medical History:  Diagnosis Date  . Barrett esophagus 05/04/2012  . Elevated blood pressure reading without diagnosis of hypertension 09/23/2014  . GERD (gastroesophageal reflux disease) 04/06/2011  . Hernia, umbilical 04/06/2011  . Hypogonadism in male 02/09/2011  . OSA (obstructive sleep apnea)    suspected: referred to pulm/sleep med by Dr. Claiborne Billings 08/2017.  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  . Seasonal allergic rhinitis   . 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 ESOPHAGOGASTRODUODENOSCOPY  05/14/11   Done for epigastric pain: distal esoph diverticulum, Barrett's esoph at GE jxn, hiatal hernia  . INGUINAL HERNIA REPAIR     on right, mesh in place  . VASECTOMY  2001    Outpatient Medications Prior to Visit  Medication Sig Dispense Refill  . aspirin 81 MG chewable tablet Chew 81 mg by mouth.    . B-D 3CC LUER-LOK SYR 22GX1-1/2 22G X 1-1/2" 3 ML MISC 1 DOSE IM EVERY 2 WEEKS FOR TESTOSTERONE INJECTION 50 each 0  . fexofenadine (ALLEGRA) 180 MG tablet Take 180 mg by mouth daily.    Marland Kitchen ibuprofen (ADVIL,MOTRIN) 200 MG tablet Take 200 mg by mouth every 6 (six) hours as needed.    Marland Kitchen omeprazole (PRILOSEC OTC) 20 MG tablet Take 1 tablet by mouth daily.    . sildenafil (REVATIO) 20 MG tablet 2-3 tabs po qd prn 30 tablet 3  . testosterone cypionate (DEPOTESTOSTERONE  CYPIONATE) 200 MG/ML injection INJECT INTO THE MUSCLE EVERY 14 DAYS 10 mL 0  . nystatin (MYCOSTATIN) 100000 UNIT/ML suspension Take 5 mLs (500,000 Units total) by mouth 4 (four) times daily. (Patient not taking: Reported on 02/18/2018) 250 mL 0   No facility-administered medications prior to visit.     Allergies  Allergen Reactions  . Hydrocodone Other (See Comments)    Patient uuable to breathe  . Ciprofloxacin   . Clavulanic Acid Rash    ROS As per HPI  PE: Blood pressure 136/84, pulse 77, temperature 98.2 F (36.8 C), temperature source Oral, resp. rate 16, height 5' 10.5" (1.791 m), weight 196 lb 4 oz (89 kg), SpO2 97 %. Body mass index is 27.76 kg/m.  Gen: Alert, well appearing.  Patient is oriented to person, place, time, and situation. AFFECT: pleasant, lucid thought and speech. Eyes: PERRL, EOMI.  NO soft tissue swelling, no lid erythema, nodule, or tenderness.  He has mild diffuse R eye palpebral and bulbar conjunctival injection.  No active exudate. Left eye normal.  LABS:  Lab Results  Component Value Date   WBC 7.8 10/08/2017   HGB 17.8 (H) 10/08/2017   HCT 52.9 (H) 10/08/2017   MCV 89.1 10/08/2017   PLT 339 10/08/2017   Lab Results  Component Value Date   TESTOSTERONE 574.03  10/08/2017   Lab Results  Component Value Date   PSA 2.17 02/12/2017   PSA 1.09 10/03/2015   PSA 1.37 11/03/2013     IMPRESSION AND PLAN:  Acute R eye conjunctivitis: likely viral, but possibly bacterial. Polytrim gtts: 2 drops R eye q6h x 7d.  Of note, we discussed the fact that he is due for f/u hypogonadism and secondary polycythemia in a couple months (needs testost, cbc, and PSA recheck at that time).  He expressed understanding and agreed.  An After Visit Summary was printed and given to the patient.  FOLLOW UP: Return if symptoms worsen or fail to improve.  Signed:  Santiago BumpersPhil Khyri Hinzman, MD           02/18/2018

## 2018-03-01 ENCOUNTER — Other Ambulatory Visit: Payer: Self-pay | Admitting: Emergency Medicine

## 2018-03-17 ENCOUNTER — Encounter: Payer: Self-pay | Admitting: Family Medicine

## 2018-03-18 ENCOUNTER — Other Ambulatory Visit: Payer: Self-pay | Admitting: Family Medicine

## 2018-03-18 MED ORDER — TESTOSTERONE CYPIONATE 200 MG/ML IM SOLN: INTRAMUSCULAR | 0 refills | Status: DC

## 2018-03-18 NOTE — Telephone Encounter (Signed)
Please advise. Thanks.  

## 2018-03-22 ENCOUNTER — Telehealth: Payer: Self-pay

## 2018-03-22 NOTE — Telephone Encounter (Signed)
Patient requesting new Rx for testosterone sent to a different pharmacy. [facility] does not have medication due to being on back order. Requesting [facility].

## 2018-03-23 ENCOUNTER — Other Ambulatory Visit: Payer: Self-pay | Admitting: *Deleted

## 2018-03-23 MED ORDER — TESTOSTERONE CYPIONATE 200 MG/ML IM SOLN: INTRAMUSCULAR | 0 refills | Status: DC

## 2018-03-23 NOTE — Telephone Encounter (Signed)
Fax from [facility] stating that they do not have testosterone 1ml vials, they are on backorder but [facility] [address] [facility]. They are [facility] [address]  Please advise. Thanks.

## 2018-06-10 ENCOUNTER — Other Ambulatory Visit: Payer: Self-pay | Admitting: Family Medicine

## 2018-06-12 ENCOUNTER — Ambulatory Visit: Payer: Self-pay | Admitting: Nurse Practitioner

## 2018-06-12 ENCOUNTER — Encounter: Payer: Self-pay | Admitting: Nurse Practitioner

## 2018-06-12 VITALS — BP 130/80 | HR 84 | Temp 98.9°F | Resp 16 | Ht 70.0 in | Wt 194.6 lb

## 2018-06-12 DIAGNOSIS — J019 Acute sinusitis, unspecified: Secondary | ICD-10-CM

## 2018-06-12 DIAGNOSIS — H6123 Impacted cerumen, bilateral: Secondary | ICD-10-CM

## 2018-06-12 MED ORDER — FLUTICASONE PROPIONATE 50 MCG/ACT NA SUSP: 2.0000 | Freq: Every day | NASAL | 0 refills | Status: DC

## 2018-06-12 MED ORDER — MONTELUKAST SODIUM 10 MG PO TABS: 10.0000 mg | ORAL_TABLET | Freq: Every day | ORAL | 0 refills | Status: DC

## 2018-06-12 MED ORDER — AMOXICILLIN 875 MG PO TABS: 875.0000 mg | ORAL_TABLET | Freq: Two times a day (BID) | ORAL | 0 refills | Status: DC

## 2018-06-12 NOTE — Progress Notes (Signed)
Subjective:  Ryan Dodson is a 57 y.o. male who presents for evaluation of possible sinusitis.  Symptoms include left ear pressure/pain, nasal congestion, no fever, sinus pressure, sinus pain and headache.  Onset of symptoms was 7 days ago, and has been unchanged since that time.  Treatment to date:  none.  High risk factors for influenza complications:  none.  The following portions of the patient's history were reviewed and updated as appropriate:  allergies, current medications and past medical history.  Constitutional: positive for fatigue, negative for anorexia, chills and malaise Eyes: negative Ears, nose, mouth, throat, and face: positive for nasal congestion, sore throat and muffled hearing, ears feel "clogged", negative for ear drainage, earaches and hoarseness Respiratory: negative Cardiovascular: negative Gastrointestinal: negative Neurological: positive for headaches, negative for coordination problems, dizziness, paresthesia, seizures, speech problems, tremors, vertigo and weakness Allergic/Immunologic: positive for hay fever Objective:  BP 130/80 (BP Location: Right Arm, Patient Position: Sitting, Cuff Size: Normal)   Pulse 84   Temp 98.9 F (37.2 C) (Oral)   Resp 16   Ht 5\' 10"  (1.778 m)   Wt 194 lb 9.6 oz (88.3 kg)   BMI 27.92 kg/m  General appearance: alert, cooperative, fatigued and no distress Head: Normocephalic, without obvious abnormality, atraumatic Eyes: conjunctivae/corneas clear. PERRL, EOM's intact. Fundi benign. Ears: cerumen obstructing both ear canals, unable to visualize TM Nose: no discharge, turbinates swollen, inflamed, moderate maxillary sinus tenderness left, mild frontal sinus tenderness left Throat: lips, mucosa, and tongue normal; teeth and gums normal Lungs: clear to auscultation bilaterally Heart: regular rate and rhythm, S1, S2 normal, no murmur, click, rub or gallop Abdomen: soft, non-tender; bowel sounds normal; no masses,  no  organomegaly Pulses: 2+ and symmetric Skin: Skin color, texture, turgor normal. No rashes or lesions Lymph nodes: cervical and submandibular nodes normal Neurologic: Grossly normal    Assessment:  Acute Viral Sinusitis and Bilateral Cerumen Impaction    Plan:   Exam findings, diagnosis etiology and medication use and indications reviewed with patient. Follow- Up and discharge instructions provided. No emergent/urgent issues found on exam. Patient education was provided. Patient verbalized understanding of information provided and agrees with plan of care (POC), all questions answered. The patient is advised to call or return to clinic if condition does not see an improvement in symptoms, or to seek the care of the closest emergency department if condition worsens with the above plan.   1. Acute sinusitis, recurrence not specified, unspecified location  - fluticasone (FLONASE) 50 MCG/ACT nasal spray; Place 2 sprays into both nostrils daily for 10 days.  Dispense: 16 g; Refill: 0 - montelukast (SINGULAIR) 10 MG tablet; Take 1 tablet (10 mg total) by mouth at bedtime.  Dispense: 30 tablet; Refill: 0 - amoxicillin (AMOXIL) 875 MG tablet; Take 1 tablet (875 mg total) by mouth 2 (two) times daily.  Dispense: 20 tablet; Refill: 0 -Take Singulair and use Flonase for the next 72 hours to see if symptoms improve.  Takes Singulair at bedtime and Flonase 2 sprays in each nostril daily in the mornings.  May also continue Allegra in the mornings. -If symptoms do not improve within the next 72 hours, start amoxicillin 875 mg twice a day for 10 days. -May continue using normal saline nasal spray as needed. -Follow-up with primary care physician if symptoms do not improve within the next 7 to 10 days.  2. Bilateral impacted cerumen   -Take Singulair and use Flonase for the next 72 hours to see if symptoms  improve.  Takes Singulair at bedtime and Flonase 2 sprays in each nostril daily in the mornings.  May  also continue Allegra in the mornings. -Ibuprofen or Tylenol for pain, fever, or general discomfort. -I recommend getting an over-the-counter earwax softener to use as needed. -Follow-up with primary care physician if symptoms do not improve within the next 7 to 10 days.

## 2018-06-12 NOTE — Patient Instructions (Signed)
Sinusitis, Adult -Take Singulair and use Flonase for the next 72 hours to see if symptoms improve.  Takes Singulair at bedtime and Flonase 2 sprays in each nostril daily in the mornings.  May also continue Allegra in the mornings. -If symptoms do not improve within the next 72 hours, start amoxicillin 875 mg twice a day for 10 days. -May continue using normal saline nasal spray as needed. -Ibuprofen or Tylenol for pain, fever, or general discomfort. -I recommend getting an over-the-counter earwax softener to use as needed. -Follow-up with primary care physician if symptoms do not improve within the next 7 to 10 days.   Sinusitis is soreness and inflammation of your sinuses. Sinuses are hollow spaces in the bones around your face. Your sinuses are located:  Around your eyes.  In the middle of your forehead.  Behind your nose.  In your cheekbones.  Your sinuses and nasal passages are lined with a stringy fluid (mucus). Mucus normally drains out of your sinuses. When your nasal tissues become inflamed or swollen, the mucus can become trapped or blocked so air cannot flow through your sinuses. This allows bacteria, viruses, and funguses to grow, which leads to infection. Sinusitis can develop quickly and last for 7?10 days (acute) or for more than 12 weeks (chronic). Sinusitis often develops after a cold. What are the causes? This condition is caused by anything that creates swelling in the sinuses or stops mucus from draining, including:  Allergies.  Asthma.  Bacterial or viral infection.  Abnormally shaped bones between the nasal passages.  Nasal growths that contain mucus (nasal polyps).  Narrow sinus openings.  Pollutants, such as chemicals or irritants in the air.  A foreign object stuck in the nose.  A fungal infection. This is rare.  What increases the risk? The following factors may make you more likely to develop this condition:  Having allergies or asthma.  Having  had a recent cold or respiratory tract infection.  Having structural deformities or blockages in your nose or sinuses.  Having a weak immune system.  Doing a lot of swimming or diving.  Overusing nasal sprays.  Smoking.  What are the signs or symptoms? The main symptoms of this condition are pain and a feeling of pressure around the affected sinuses. Other symptoms include:  Upper toothache.  Earache.  Headache.  Bad breath.  Decreased sense of smell and taste.  A cough that may get worse at night.  Fatigue.  Fever.  Thick drainage from your nose. The drainage is often green and it may contain pus (purulent).  Stuffy nose or congestion.  Postnasal drip. This is when extra mucus collects in the throat or back of the nose.  Swelling and warmth over the affected sinuses.  Sore throat.  Sensitivity to light.  How is this diagnosed? This condition is diagnosed based on symptoms, a medical history, and a physical exam. To find out if your condition is acute or chronic, your health care provider may:  Look in your nose for signs of nasal polyps.  Tap over the affected sinus to check for signs of infection.  View the inside of your sinuses using an imaging device that has a light attached (endoscope).  If your health care provider suspects that you have chronic sinusitis, you may also:  Be tested for allergies.  Have a sample of mucus taken from your nose (nasal culture) and checked for bacteria.  Have a mucus sample examined to see if your sinusitis is related  to an allergy.  If your sinusitis does not respond to treatment and it lasts longer than 8 weeks, you may have an MRI or CT scan to check your sinuses. These scans also help to determine how severe your infection is. In rare cases, a bone biopsy may be done to rule out more serious types of fungal sinus disease. How is this treated? Treatment for sinusitis depends on the cause and whether your condition  is chronic or acute. If a virus is causing your sinusitis, your symptoms will go away on their own within 10 days. You may be given medicines to relieve your symptoms, including:  Topical nasal decongestants. They shrink swollen nasal passages and let mucus drain from your sinuses.  Antihistamines. These drugs block inflammation that is triggered by allergies. This can help to ease swelling in your nose and sinuses.  Topical nasal corticosteroids. These are nasal sprays that ease inflammation and swelling in your nose and sinuses.  Nasal saline washes. These rinses can help to get rid of thick mucus in your nose.  If your condition is caused by bacteria, you will be given an antibiotic medicine. If your condition is caused by a fungus, you will be given an antifungal medicine. Surgery may be needed to correct underlying conditions, such as narrow nasal passages. Surgery may also be needed to remove polyps. Follow these instructions at home: Medicines  Take, use, or apply over-the-counter and prescription medicines only as told by your health care provider. These may include nasal sprays.  If you were prescribed an antibiotic medicine, take it as told by your health care provider. Do not stop taking the antibiotic even if you start to feel better. Hydrate and Humidify  Drink enough water to keep your urine clear or pale yellow. Staying hydrated will help to thin your mucus.  Use a cool mist humidifier to keep the humidity level in your home above 50%.  Inhale steam for 10-15 minutes, 3-4 times a day or as told by your health care provider. You can do this in the bathroom while a hot shower is running.  Limit your exposure to cool or dry air. Rest  Rest as much as possible.  Sleep with your head raised (elevated).  Make sure to get enough sleep each night. General instructions  Apply a warm, moist washcloth to your face 3-4 times a day or as told by your health care provider. This  will help with discomfort.  Wash your hands often with soap and water to reduce your exposure to viruses and other germs. If soap and water are not available, use hand sanitizer.  Do not smoke. Avoid being around people who are smoking (secondhand smoke).  Keep all follow-up visits as told by your health care provider. This is important. Contact a health care provider if:  You have a fever.  Your symptoms get worse.  Your symptoms do not improve within 10 days. Get help right away if:  You have a severe headache.  You have persistent vomiting.  You have pain or swelling around your face or eyes.  You have vision problems.  You develop confusion.  Your neck is stiff.  You have trouble breathing. This information is not intended to replace advice given to you by your health care provider. Make sure you discuss any questions you have with your health care provider. Document Released: 08/17/2005 Document Revised: 04/12/2016 Document Reviewed: 06/12/2015 Elsevier Interactive Patient Education  2018 [employer].  Earwax Buildup, Adult The  ears produce a substance called earwax that helps keep bacteria out of the ear and protects the skin in the ear canal. Occasionally, earwax can build up in the ear and cause discomfort or hearing loss. What increases the risk? This condition is more likely to develop in people who:  Are male.  Are elderly.  Naturally produce more earwax.  Clean their ears often with cotton swabs.  Use earplugs often.  Use in-ear headphones often.  Wear hearing aids.  Have narrow ear canals.  Have earwax that is overly thick or sticky.  Have eczema.  Are dehydrated.  Have excess hair in the ear canal.  What are the signs or symptoms? Symptoms of this condition include:  Reduced or muffled hearing.  A feeling of fullness in the ear or feeling that the ear is plugged.  Fluid coming from the ear.  Ear pain.  Ear itch.  Ringing in  the ear.  Coughing.  An obvious piece of earwax that can be seen inside the ear canal.  How is this diagnosed? This condition may be diagnosed based on:  Your symptoms.  Your medical history.  An ear exam. During the exam, your health care provider will look into your ear with an instrument called an otoscope.  You may have tests, including a hearing test. How is this treated? This condition may be treated by:  Using ear drops to soften the earwax.  Having the earwax removed by a health care provider. The health care provider may: ? Flush the ear with water. ? Use an instrument that has a loop on the end (curette). ? Use a suction device.  Surgery to remove the wax buildup. This may be done in severe cases.  Follow these instructions at home:  Take over-the-counter and prescription medicines only as told by your health care provider.  Do not put any objects, including cotton swabs, into your ear. You can clean the opening of your ear canal with a washcloth or facial tissue.  Follow instructions from your health care provider about cleaning your ears. Do not over-clean your ears.  Drink enough fluid to keep your urine clear or pale yellow. This will help to thin the earwax.  Keep all follow-up visits as told by your health care provider. If earwax builds up in your ears often or if you use hearing aids, consider seeing your health care provider for routine, preventive ear cleanings. Ask your health care provider how often you should schedule your cleanings.  If you have hearing aids, clean them according to instructions from the manufacturer and your health care provider. Contact a health care provider if:  You have ear pain.  You develop a fever.  You have blood, pus, or other fluid coming from your ear.  You have hearing loss.  You have ringing in your ears that does not go away.  Your symptoms do not improve with treatment.  You feel like the room is spinning  (vertigo). Summary  Earwax can build up in the ear and cause discomfort or hearing loss.  The most common symptoms of this condition include reduced or muffled hearing and a feeling of fullness in the ear or feeling that the ear is plugged.  This condition may be diagnosed based on your symptoms, your medical history, and an ear exam.  This condition may be treated by using ear drops to soften the earwax or by having the earwax removed by a health care provider.  Do not put any  objects, including cotton swabs, into your ear. You can clean the opening of your ear canal with a washcloth or facial tissue. This information is not intended to replace advice given to you by your health care provider. Make sure you discuss any questions you have with your health care provider. Document Released: 09/24/2004 Document Revised: 10/28/2016 Document Reviewed: 10/28/2016 Elsevier Interactive Patient Education  [employer].

## 2018-06-14 ENCOUNTER — Telehealth: Payer: Self-pay

## 2018-06-14 NOTE — Telephone Encounter (Signed)
Patient states he feels good and he is happy they way he was treated at [facility].

## 2018-06-18 ENCOUNTER — Encounter: Payer: Self-pay | Admitting: Family Medicine

## 2018-06-20 MED ORDER — NYSTATIN 100000 UNIT/ML MT SUSP: OROMUCOSAL | 1 refills | Status: DC

## 2018-06-20 NOTE — Telephone Encounter (Signed)
Please advise. Thanks.  

## 2018-06-20 NOTE — Telephone Encounter (Signed)
I eRx'd nystatin with 1 RF.

## 2018-08-04 ENCOUNTER — Encounter: Payer: Self-pay | Admitting: Family Medicine

## 2018-08-04 ENCOUNTER — Ambulatory Visit: Payer: Self-pay | Admitting: Family Medicine

## 2018-08-04 VITALS — BP 158/81 | HR 78 | Temp 98.4°F | Resp 16 | Ht 70.5 in | Wt 197.2 lb

## 2018-08-04 DIAGNOSIS — E291 Testicular hypofunction: Secondary | ICD-10-CM

## 2018-08-04 DIAGNOSIS — Z125 Encounter for screening for malignant neoplasm of prostate: Secondary | ICD-10-CM

## 2018-08-04 DIAGNOSIS — N4289 Other specified disorders of prostate: Secondary | ICD-10-CM

## 2018-08-04 DIAGNOSIS — E663 Overweight: Secondary | ICD-10-CM

## 2018-08-04 DIAGNOSIS — Z0000 Encounter for general adult medical examination without abnormal findings: Secondary | ICD-10-CM

## 2018-08-04 DIAGNOSIS — D751 Secondary polycythemia: Secondary | ICD-10-CM

## 2018-08-04 MED ORDER — TESTOSTERONE CYPIONATE 200 MG/ML IM SOLN: INTRAMUSCULAR | 0 refills | Status: DC

## 2018-08-04 NOTE — Progress Notes (Signed)
Office Note 08/04/2018  CC:  Chief Complaint  Patient presents with  . Annual Exam    Pt is fasting.     HPI:  Ryan Dodson is a 57 y.o. White male who is here for annual health maintenance exam. No acute complaints.  Exercise: working out 5 days a week. Diet: tries to eat healthy.  Testosterone dosing is 187.5 mg every 6-8 days, donates blood q6-8 wks.  Most recent donation was 4 d/a and Hb there was 17.1.  BP at that time was 128/78. Most recent testost dose was 9 d/a.   Past Medical History:  Diagnosis Date  . Barrett esophagus 05/04/2012  . Elevated blood pressure reading without diagnosis of hypertension 09/23/2014  . GERD (gastroesophageal reflux disease) 04/06/2011  . Hernia, umbilical 04/06/2011  . Hypogonadism in male 02/09/2011  . OSA (obstructive sleep apnea)    suspected: referred to pulm/sleep med by Dr. Claiborne Billings 08/2017.  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  . Seasonal allergic rhinitis   . 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 ESOPHAGOGASTRODUODENOSCOPY  05/14/11   Done for epigastric pain: distal esoph diverticulum, Barrett's esoph at GE jxn, hiatal hernia  . INGUINAL HERNIA REPAIR     on right, mesh in place  . VASECTOMY  2001    Family History  Problem Relation Age of Onset  . Hypotension Mother   . Arthritis Father   . Cancer Father     Social History   Socioeconomic History  . Marital status: Unknown    Spouse name: Not on file  . Number of children: 2  . Years of education: Not on file  . Highest education level: Not on file  Occupational History  . Occupation: Chief Operating Officer: [employer]  Social Needs  . Financial resource strain: Not on file  . Food insecurity:    Worry: Not on file    Inability: Not on file  . Transportation needs:    Medical: Not on file    Non-medical: Not  on file  Tobacco Use  . Smoking status: Never Smoker  . Smokeless tobacco: Former Engineer, water and Sexual Activity  . Alcohol use: No    Alcohol/week: 0.0 standard drinks    Comment: socially  . Drug use: No  . Sexual activity: Yes  Lifestyle  . Physical activity:    Days per week: Not on file    Minutes per session: Not on file  . Stress: Not on file  Relationships  . Social connections:    Talks on phone: Not on file    Gets together: Not on file    Attends religious service: Not on file    Active member of club or organization: Not on file    Attends meetings of clubs or organizations: Not on file    Relationship status: Not on file  . Intimate partner violence:    Fear of current or ex partner: Not on file    Emotionally abused: Not on file    Physically abused: Not on file    Forced sexual activity: Not on file  Other Topics Concern  . Not on file  Social History Narrative   Divorced, several children.   Has girlfriend who lives in [place], [place].   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   No T/A/Ds.  Outpatient Medications Prior to Visit  Medication Sig Dispense Refill  . B-D 3CC LUER-LOK SYR 22GX1-1/2 22G X 1-1/2" 3 ML MISC 1 DOSE IM EVERY 2 WEEKS FOR TESTOSTERONE INJECTION 50 each 0  . fexofenadine (ALLEGRA) 180 MG tablet Take 180 mg by mouth daily.    Marland Kitchen ibuprofen (ADVIL,MOTRIN) 200 MG tablet Take 200 mg by mouth every 6 (six) hours as needed.    Marland Kitchen omeprazole (PRILOSEC OTC) 20 MG tablet Take 1 tablet by mouth daily.    . sildenafil (REVATIO) 20 MG tablet 2-3 tabs po qd prn 30 tablet 3  . testosterone cypionate (DEPOTESTOSTERONE CYPIONATE) 200 MG/ML injection INJECT INTO THE MUSCLE EVERY 14 DAYS 6 mL 0  . amoxicillin (AMOXIL) 875 MG tablet Take 1 tablet (875 mg total) by mouth 2 (two) times daily. (Patient not taking: Reported on 08/04/2018) 20 tablet 0  . aspirin 81 MG chewable tablet Chew 81 mg by mouth.    . fluticasone (FLONASE) 50 MCG/ACT nasal spray  Place 2 sprays into both nostrils daily for 10 days. 16 g 0  . montelukast (SINGULAIR) 10 MG tablet Take 1 tablet (10 mg total) by mouth at bedtime. 30 tablet 0  . nystatin (MYCOSTATIN) 100000 UNIT/ML suspension 5 ml po swish, gargle, and spit qid x 14 days (Patient not taking: Reported on 08/04/2018) 280 mL 1   No facility-administered medications prior to visit.     Allergies  Allergen Reactions  . Hydrocodone Other (See Comments)    Patient uuable to breathe  . Ciprofloxacin   . Clavulanic Acid Rash    ROS Review of Systems  Constitutional: Negative for appetite change, chills, fatigue and fever.  HENT: Negative for congestion, dental problem, ear pain and sore throat.   Eyes: Negative for discharge, redness and visual disturbance.  Respiratory: Negative for cough, chest tightness, shortness of breath and wheezing.   Cardiovascular: Negative for chest pain, palpitations and leg swelling.  Gastrointestinal: Negative for abdominal pain, blood in stool, diarrhea, nausea and vomiting.  Genitourinary: Negative for difficulty urinating, dysuria, flank pain, frequency, hematuria and urgency.  Musculoskeletal: Negative for arthralgias, back pain, joint swelling, myalgias and neck stiffness.  Skin: Negative for pallor and rash.  Neurological: Negative for dizziness, speech difficulty, weakness and headaches.  Hematological: Negative for adenopathy. Does not bruise/bleed easily.  Psychiatric/Behavioral: Negative for confusion and sleep disturbance. The patient is not nervous/anxious.     PE; Blood pressure (!) 158/81, pulse 78, temperature 98.4 F (36.9 C), temperature source Oral, resp. rate 16, height 5' 10.5" (1.791 m), weight 197 lb 4 oz (89.5 kg), SpO2 98 %. Gen: Alert, well appearing.  Patient is oriented to person, place, time, and situation. AFFECT: pleasant, lucid thought and speech. ENT: Ears: EACs clear, normal epithelium.  TMs with good light reflex and landmarks bilaterally.   Eyes: no injection, icteris, swelling, or exudate.  EOMI, PERRLA. Nose: no drainage or turbinate edema/swelling.  No injection or focal lesion.  Mouth: lips without lesion/swelling.  Oral mucosa pink and moist.  Dentition intact and without obvious caries or gingival swelling.  Oropharynx without erythema, exudate, or swelling.  Neck: supple/nontender.  No LAD, mass, or TM.  Carotid pulses 2+ bilaterally, without bruits. CV: RRR, no m/r/g.   LUNGS: CTA bilat, nonlabored resps, good aeration in all lung fields. ABD: soft, NT, ND, BS normal.  No hepatospenomegaly or mass.  No bruits. EXT: no clubbing, cyanosis, or edema.  Musculoskeletal: no joint swelling, erythema, warmth, or tenderness.  ROM of all  joints intact. Skin - no sores or suspicious lesions or rashes or color changes Rectal exam: negative without mass, lesions or tenderness, PROSTATE EXAM: Enlarged 1+, with mild asymmetry (R lobe larger than L), without nodule or fluctuance or tenderness.   Pertinent labs:  Lab Results  Component Value Date   TSH 1.67 08/14/2016   Lab Results  Component Value Date   WBC 7.8 10/08/2017   HGB 17.8 (H) 10/08/2017   HCT 52.9 (H) 10/08/2017   MCV 89.1 10/08/2017   PLT 339 10/08/2017   Lab Results  Component Value Date   CREATININE 1.07 08/14/2016   BUN 20 08/14/2016   NA 138 08/14/2016   K 4.7 08/14/2016   CL 98 08/14/2016   CO2 32 08/14/2016   Lab Results  Component Value Date   ALT 69 (H) 08/14/2016   AST 31 08/14/2016   ALKPHOS 53 08/14/2016   BILITOT 0.4 08/14/2016   Lab Results  Component Value Date   CHOL 202 (H) 09/24/2014   Lab Results  Component Value Date   HDL 35.80 (L) 09/24/2014   Lab Results  Component Value Date   LDLCALC 109 (H) 11/03/2013   Lab Results  Component Value Date   TRIG 279.0 (H) 09/24/2014   Lab Results  Component Value Date   CHOLHDL 6 09/24/2014   Lab Results  Component Value Date   PSA 2.17 02/12/2017   PSA 1.09 10/03/2015   PSA  1.37 11/03/2013   Lab Results  Component Value Date   TESTOSTERONE 574.03 10/08/2017    ASSESSMENT AND PLAN:   Health maintenance exam: Reviewed age and gender appropriate health maintenance issues (prudent diet, regular exercise, health risks of tobacco and excessive alcohol, use of seatbelts, fire alarms in home, use of sunscreen).  Also reviewed age and gender appropriate health screening as well as vaccine recommendations. Vaccines: Flu vaccine--> pt declined today.  Tdap-->last given 4 yrs ago per his report today.   Shingrix-->he declines this at this time. Labs: Health panel + PSA.  Testosterone. Prostate ca screening: Pt with hx of prostate asymmetry.  DRE today unchanged from prior.  Obtained PSA today.   Colon ca screening: Overdue for screening colonoscopy.  He says this is too cost prohibitive.  He also declines stool based testing for screening. Additionally, his last EGD 2013-->Barrett's-->due for f/u and repeat EGD.  He expressed understanding. He says he may get a colonoscopy and EGD in [place] when he goes to visit his GF soon.  An After Visit Summary was printed and given to the patient.  FOLLOW UP:  Return in about 1 year (around 08/05/2019) for annual CPE (fasting).  Signed:  Santiago BumpersPhil Neftaly Swiss, MD           08/04/2018

## 2018-08-05 ENCOUNTER — Encounter: Payer: Self-pay | Admitting: Family Medicine

## 2018-08-05 LAB — LIPID PANEL
CHOL/HDL RATIO: 8
CHOLESTEROL: 235 mg/dL — AB (ref 0–200)
HDL: 28 mg/dL — AB (ref >39.00)
NonHDL: 206.63
Triglycerides: 241 mg/dL — ABNORMAL HIGH (ref 0.0–149.0)
VLDL: 48.2 mg/dL — AB (ref 0.0–40.0)

## 2018-08-05 LAB — CBC WITH DIFFERENTIAL/PLATELET
Basophils Absolute: 0.1 10*3/uL (ref 0.0–0.1)
Basophils Relative: 0.5 % (ref 0.0–3.0)
EOS PCT: 3 % (ref 0.0–5.0)
HEMATOCRIT: 48.5 % (ref 39.0–52.0)
HEMOGLOBIN: 15.5 g/dL (ref 13.0–17.0)
LYMPHS PCT: 19.4 % (ref 12.0–46.0)
Lymphs Abs: 2.2 10*3/uL (ref 0.7–4.0)
MCHC: 31.9 g/dL (ref 30.0–36.0)
MCV: 85.4 fl (ref 78.0–100.0)
Monocytes Absolute: 1.2 10*3/uL — ABNORMAL HIGH (ref 0.1–1.0)
Monocytes Relative: 10.4 % (ref 3.0–12.0)
Neutro Abs: 7.4 10*3/uL (ref 1.4–7.7)
Neutrophils Relative %: 66.7 % (ref 43.0–77.0)
Platelets: 372 10*3/uL (ref 150.0–400.0)
RBC: 5.68 Mil/uL (ref 4.22–5.81)
RDW: 16.5 % — ABNORMAL HIGH (ref 11.5–15.5)

## 2018-08-05 LAB — COMPREHENSIVE METABOLIC PANEL
ALT: 68 U/L — ABNORMAL HIGH (ref 0–53)
AST: 52 U/L — ABNORMAL HIGH (ref 0–37)
Albumin: 4.5 g/dL (ref 3.5–5.2)
Alkaline Phosphatase: 53 U/L (ref 39–117)
BILIRUBIN TOTAL: 0.3 mg/dL (ref 0.2–1.2)
BUN: 19 mg/dL (ref 6–23)
CO2: 27 meq/L (ref 19–32)
CREATININE: 1.16 mg/dL (ref 0.40–1.50)
Calcium: 9.7 mg/dL (ref 8.4–10.5)
Chloride: 99 mEq/L (ref 96–112)
GFR: 68.56 mL/min (ref >60.00)
GLUCOSE: 87 mg/dL (ref 70–99)
Potassium: 4.4 mEq/L (ref 3.5–5.1)
Sodium: 136 mEq/L (ref 135–145)
Total Protein: 7.7 g/dL (ref 6.0–8.3)

## 2018-08-05 LAB — LDL CHOLESTEROL, DIRECT: Direct LDL: 195 mg/dL

## 2018-08-05 LAB — TESTOSTERONE: Testosterone: 212.9 ng/dL — ABNORMAL LOW (ref 300.00–890.00)

## 2018-08-05 LAB — PSA: PSA: 1.42 ng/mL (ref 0.10–4.00)

## 2018-08-05 LAB — TSH: TSH: 1.72 u[IU]/mL (ref 0.35–4.50)

## 2018-08-07 ENCOUNTER — Encounter: Payer: Self-pay | Admitting: Family Medicine

## 2018-08-08 ENCOUNTER — Other Ambulatory Visit: Payer: Self-pay | Admitting: Family Medicine

## 2018-08-08 DIAGNOSIS — E782 Mixed hyperlipidemia: Secondary | ICD-10-CM

## 2018-08-16 ENCOUNTER — Encounter: Payer: Self-pay | Admitting: *Deleted

## 2018-08-19 ENCOUNTER — Encounter: Payer: Self-pay | Admitting: *Deleted

## 2018-09-03 ENCOUNTER — Ambulatory Visit: Payer: Self-pay | Admitting: Nurse Practitioner

## 2018-09-03 ENCOUNTER — Encounter: Payer: Self-pay | Admitting: Nurse Practitioner

## 2018-09-03 VITALS — BP 114/80 | HR 84 | Temp 98.9°F | Resp 12 | Wt 195.0 lb

## 2018-09-03 DIAGNOSIS — B9689 Other specified bacterial agents as the cause of diseases classified elsewhere: Secondary | ICD-10-CM

## 2018-09-03 DIAGNOSIS — J019 Acute sinusitis, unspecified: Secondary | ICD-10-CM

## 2018-09-03 MED ORDER — AZITHROMYCIN 250 MG PO TABS: ORAL_TABLET | ORAL | 0 refills | Status: AC

## 2018-09-03 MED ORDER — MONTELUKAST SODIUM 10 MG PO TABS: 10.0000 mg | ORAL_TABLET | Freq: Every day | ORAL | 0 refills | Status: DC

## 2018-09-03 NOTE — Patient Instructions (Signed)
Sinusitis, Adult  -Take medication as prescribed. -Ibuprofen or Tylenol for pain, fever, or general discomfort. -Increase fluids. -Sleep elevated on at least 2 pillows at bedtime to help with cough. -Use a humidifier or vaporizer when at home and during sleep. -May use a teaspoon of honey or over-the-counter cough drops to help with cough. -May use normal saline nasal spray to help with nasal congestion throughout the day. -Follow-up with your PCP or ENT if symptoms do not improve.    Sinusitis is inflammation of your sinuses. Sinuses are hollow spaces in the bones around your face. Your sinuses are located:  Around your eyes.  In the middle of your forehead.  Behind your nose.  In your cheekbones. Mucus normally drains out of your sinuses. When your nasal tissues become inflamed or swollen, mucus can become trapped or blocked. This allows bacteria, viruses, and fungi to grow, which leads to infection. Most infections of the sinuses are caused by a virus. Sinusitis can develop quickly. It can last for up to 4 weeks (acute) or for more than 12 weeks (chronic). Sinusitis often develops after a cold. What are the causes? This condition is caused by anything that creates swelling in the sinuses or stops mucus from draining. This includes:  Allergies.  Asthma.  Infection from bacteria or viruses.  Deformities or blockages in your nose or sinuses.  Abnormal growths in the nose (nasal polyps).  Pollutants, such as chemicals or irritants in the air.  Infection from fungi (rare). What increases the risk? You are more likely to develop this condition if you:  Have a weak body defense system (immune system).  Do a lot of swimming or diving.  Overuse nasal sprays.  Smoke. What are the signs or symptoms? The main symptoms of this condition are pain and a feeling of pressure around the affected sinuses. Other symptoms include:  Stuffy nose or congestion.  Thick drainage from  your nose.  Swelling and warmth over the affected sinuses.  Headache.  Upper toothache.  A cough that may get worse at night.  Extra mucus that collects in the throat or the back of the nose (postnasal drip).  Decreased sense of smell and taste.  Fatigue.  A fever.  Sore throat.  Bad breath. How is this diagnosed? This condition is diagnosed based on:  Your symptoms.  Your medical history.  A physical exam.  Tests to find out if your condition is acute or chronic. This may include: ? Checking your nose for nasal polyps. ? Viewing your sinuses using a device that has a light (endoscope). ? Testing for allergies or bacteria. ? Imaging tests, such as an MRI or CT scan. In rare cases, a bone biopsy may be done to rule out more serious types of fungal sinus disease. How is this treated? Treatment for sinusitis depends on the cause and whether your condition is chronic or acute.  If caused by a virus, your symptoms should go away on their own within 10 days. You may be given medicines to relieve symptoms. They include: ? Medicines that shrink swollen nasal passages (topical intranasal decongestants). ? Medicines that treat allergies (antihistamines). ? A spray that eases inflammation of the nostrils (topical intranasal corticosteroids). ? Rinses that help get rid of thick mucus in your nose (nasal saline washes).  If caused by bacteria, your health care provider may recommend waiting to see if your symptoms improve. Most bacterial infections will get better without antibiotic medicine. You may be given antibiotics if  you have: ? A severe infection. ? A weak immune system.  If caused by narrow nasal passages or nasal polyps, you may need to have surgery. Follow these instructions at home: Medicines  Take, use, or apply over-the-counter and prescription medicines only as told by your health care provider. These may include nasal sprays.  If you were prescribed an  antibiotic medicine, take it as told by your health care provider. Do not stop taking the antibiotic even if you start to feel better. Hydrate and humidify   Drink enough fluid to keep your urine pale yellow. Staying hydrated will help to thin your mucus.  Use a cool mist humidifier to keep the humidity level in your home above 50%.  Inhale steam for 10-15 minutes, 3-4 times a day, or as told by your health care provider. You can do this in the bathroom while a hot shower is running.  Limit your exposure to cool or dry air. Rest  Rest as much as possible.  Sleep with your head raised (elevated).  Make sure you get enough sleep each night. General instructions   Apply a warm, moist washcloth to your face 3-4 times a day or as told by your health care provider. This will help with discomfort.  Wash your hands often with soap and water to reduce your exposure to germs. If soap and water are not available, use hand sanitizer.  Do not smoke. Avoid being around people who are smoking (secondhand smoke).  Keep all follow-up visits as told by your health care provider. This is important. Contact a health care provider if:  You have a fever.  Your symptoms get worse.  Your symptoms do not improve within 10 days. Get help right away if:  You have a severe headache.  You have persistent vomiting.  You have severe pain or swelling around your face or eyes.  You have vision problems.  You develop confusion.  Your neck is stiff.  You have trouble breathing. Summary  Sinusitis is soreness and inflammation of your sinuses. Sinuses are hollow spaces in the bones around your face.  This condition is caused by nasal tissues that become inflamed or swollen. The swelling traps or blocks the flow of mucus. This allows bacteria, viruses, and fungi to grow, which leads to infection.  If you were prescribed an antibiotic medicine, take it as told by your health care provider. Do not  stop taking the antibiotic even if you start to feel better.  Keep all follow-up visits as told by your health care provider. This is important. This information is not intended to replace advice given to you by your health care provider. Make sure you discuss any questions you have with your health care provider. Document Released: 08/17/2005 Document Revised: 01/17/2018 Document Reviewed: 01/17/2018 Elsevier Interactive Patient Education  2019 [employer].

## 2018-09-03 NOTE — Progress Notes (Signed)
Subjective:  Ryan Dodson is a 58 y.o. male who presents for evaluation of possible sinusitis.  Symptoms include bilateral ear pressure/pain, headache described as aching, full, nasal congestion, no fever, non productive cough, sinus pressure, sinus pain, swollen glands and swelling in face and left neck.  Onset of symptoms was 3 weeks ago, and has been gradually worsening since that time.  Patient informs that since he was seen back in October, 2019, symptoms seem to improve.  Patient states he did go out of the country and had about a 13-hour flight about 3 to 4 weeks ago.  Patient states after the flight he began to develop sinus symptoms again.  Patient states he did go to [place] and while he was in [place] he was given a prescription for Augmentin.  Patient states symptoms did not resolve.  Patient informs while he was in [place] he did have a CT scan which showed some fluid in his left ear which is draining and some issues with his sinuses.  Patient states the Z-Pak normally works for him.  Treatment to date:  antibiotics and nasal spray.  High risk factors for influenza complications:  none.  Also informs he does have a history of seasonal allergies and takes fexofenadine daily for his allergies.  The following portions of the patient's history were reviewed and updated as appropriate:  allergies, current medications and past medical history.  Constitutional: positive for fatigue and malaise, negative for anorexia, fevers and sweats Eyes: negative Ears, nose, mouth, throat, and face: positive for nasal congestion and bilateral ear pain/pressure, negative for ear drainage, earaches and hoarseness Respiratory: positive for cough, negative for asthma, sputum, stridor and wheezing Cardiovascular: negative Gastrointestinal: negative Neurological: positive for headaches, negative for coordination problems, dizziness, paresthesia, vertigo and weakness Allergic/Immunologic: positive for hay  fever Objective:  BP 114/80   Pulse 84   Temp 98.9 F (37.2 C)   Resp 12   Wt 195 lb (88.5 kg)   SpO2 97%   BMI 27.58 kg/m  General appearance: alert, cooperative, fatigued and no distress Head: Normocephalic, without obvious abnormality, atraumatic Eyes: conjunctivae/corneas clear. PERRL, EOM's intact. Fundi benign. Ears: abnormal TM right ear - mucoid middle ear fluid and abnormal TM left ear - mucoid middle ear fluid, there is swelling below the left ear, + fluctuance, no redness or erythema Nose: no discharge, moderate congestion, turbinates swollen, inflamed, moderate maxillary sinus tenderness bilateral, moderate frontal sinus tenderness bilateral Throat: lips, mucosa, and tongue normal; teeth and gums normal Lungs: clear to auscultation bilaterally Heart: regular rate and rhythm, S1, S2 normal, no murmur, click, rub or gallop Abdomen: soft, non-tender; bowel sounds normal; no masses,  no organomegaly Pulses: 2+ and symmetric Skin: Skin color, texture, turgor normal. No rashes or lesions Lymph nodes: superficial cervical adenopathy bilaterally Neurologic: Grossly normal, no cranial nerve deficits    Assessment:  Acute Bacterial Sinusitis   Plan:   Exam findings, diagnosis etiology and medication use and indications reviewed with patient. Follow- Up and discharge instructions provided. No emergent/urgent issues found on exam.  Based on the patient's current symptoms, physical assessment, and duration of symptoms, will go ahead and treat with antibiotics.  Patient has an allergy to fluoroquinolones and is requesting a z-pak.   Informed patient that if this antibiotic does not help his symptoms, he has to follow up with his PCP or ENT.  Discussed with patient that if his symptoms do not resolve he will need to follow-up with ENT as he will  need further work-up and possible imaging.  Patient states he did have some imaging while in [place] and he is aware.  Patient does not require  any additional nasal spray as he request to continue using the spray that he has.  Patient education was provided. Patient verbalized understanding of information provided and agrees with plan of care (POC), all questions answered. The patient is advised to call or return to clinic if condition does not see an improvement in symptoms, or to seek the care of the closest emergency department if condition worsens with the above plan.   1. Acute bacterial sinusitis  - azithromycin (ZITHROMAX) 250 MG tablet; Take as directed.  Dispense: 6 tablet; Refill: 0 - montelukast (SINGULAIR) 10 MG tablet; Take 1 tablet (10 mg total) by mouth at bedtime.  Dispense: 30 tablet; Refill: 0 -Take medication as prescribed. -Ibuprofen or Tylenol for pain, fever, or general discomfort. -Increase fluids. -Sleep elevated on at least 2 pillows at bedtime to help with cough. -Use a humidifier or vaporizer when at home and during sleep. -May use a teaspoon of honey or over-the-counter cough drops to help with cough. -May use normal saline nasal spray to help with nasal congestion throughout the day. -Follow-up with your PCP or ENT if symptoms do not improve.

## 2018-10-21 ENCOUNTER — Other Ambulatory Visit: Payer: Self-pay | Admitting: Family Medicine

## 2018-10-21 NOTE — Telephone Encounter (Signed)
Pharmacy only dispensed 108mL.   RF request for testosterone LOV: 08/04/18 Next ov: None Last written: 08/04/18 #39mL w/ 0RF  Please advise. Thanks.

## 2018-11-08 ENCOUNTER — Ambulatory Visit (INDEPENDENT_AMBULATORY_CARE_PROVIDER_SITE_OTHER): Payer: Self-pay | Admitting: Family Medicine

## 2018-11-08 ENCOUNTER — Encounter: Payer: Self-pay | Admitting: Family Medicine

## 2018-11-08 VITALS — BP 150/80 | HR 104 | Temp 98.5°F | Resp 12 | Ht 70.5 in | Wt 197.0 lb

## 2018-11-08 DIAGNOSIS — J029 Acute pharyngitis, unspecified: Secondary | ICD-10-CM

## 2018-11-08 DIAGNOSIS — K1379 Other lesions of oral mucosa: Secondary | ICD-10-CM

## 2018-11-08 DIAGNOSIS — R030 Elevated blood-pressure reading, without diagnosis of hypertension: Secondary | ICD-10-CM

## 2018-11-08 LAB — POCT RAPID STREP A (OFFICE): RAPID STREP A SCREEN: NEGATIVE

## 2018-11-08 MED ORDER — PREDNISONE 20 MG PO TABS: 40.0000 mg | ORAL_TABLET | Freq: Every day | ORAL | 0 refills | Status: DC

## 2018-11-08 MED ORDER — AMOXICILLIN 500 MG PO CAPS: 500.0000 mg | ORAL_CAPSULE | Freq: Two times a day (BID) | ORAL | 0 refills | Status: AC

## 2018-11-08 NOTE — Progress Notes (Signed)
ACUTE VISIT  HPI:  Chief Complaint  Patient presents with  . Enlarged uvula    started last night, having trouble swallowing and breathing    Ryan Dodson is a 58 y.o.male here today complaining of constant uvular edema,sore throat,and dysphagia since last night.  For the past few days he has had mild nasal congestion on left maxillary sinus pressure. She has not noted fever, chills, body aches. Negative for cough, dyspnea, or wheezing. He denies stridor.  She has not started new medication.  No prior Hx.  Today he feels mildly better.  Sore Throat   The current episode started yesterday. The problem has been gradually improving. There has been no fever. The pain is mild. Associated symptoms include congestion, a plugged ear sensation and trouble swallowing. Pertinent negatives include no abdominal pain, coughing, diarrhea, drooling, ear discharge, ear pain, headaches, hoarse voice, neck pain, shortness of breath, stridor, swollen glands or vomiting. He has had no exposure to strep or mono.   Concerned because he is traveling to [place] in a few days.  No Hx of recent travel. No sick contact. No known insect bite.  Hx of allergies: Yes, seasonal allergies.  Currently he is on Singulair 10 mg daily and Allegra 180 mg daily. He thinks taking "too much" Allegra has contributed to above symptoms.  OTC medications for this problem: Allegra 180 mg.  BP elevated today, he denies history of hypertension. He attributes elevated BP to white coat syndrome. He states that he checks BP at home, usually 130s/80s. He denies unusual headache, chest pain, palpitations, or edema.   Review of Systems  Constitutional: Negative for activity change, appetite change, chills, fatigue and fever.  HENT: Positive for congestion, rhinorrhea, sinus pressure, sore throat and trouble swallowing. Negative for drooling, ear discharge, ear pain, hoarse voice, mouth sores, postnasal drip  and voice change.   Respiratory: Negative for cough, shortness of breath, wheezing and stridor.   Cardiovascular: Negative for chest pain, palpitations and leg swelling.  Gastrointestinal: Negative for abdominal pain, diarrhea, nausea and vomiting.  Genitourinary: Negative for decreased urine volume and hematuria.  Musculoskeletal: Negative for myalgias and neck pain.  Skin: Negative for rash.  Allergic/Immunologic: Positive for environmental allergies.  Neurological: Negative for weakness and headaches.  Hematological: Negative for adenopathy. Does not bruise/bleed easily.    Current Outpatient Medications on File Prior to Visit  Medication Sig Dispense Refill  . B-D 3CC LUER-LOK SYR 22GX1-1/2 22G X 1-1/2" 3 ML MISC 1 DOSE IM EVERY 2 WEEKS FOR TESTOSTERONE INJECTION 50 each 0  . fexofenadine (ALLEGRA) 180 MG tablet Take 180 mg by mouth daily.    Marland Kitchen ibuprofen (ADVIL,MOTRIN) 200 MG tablet Take 200 mg by mouth every 6 (six) hours as needed.    Marland Kitchen omeprazole (PRILOSEC OTC) 20 MG tablet Take 1 tablet by mouth daily.    . sildenafil (REVATIO) 20 MG tablet 2-3 tabs po qd prn 30 tablet 3  . testosterone cypionate (DEPOTESTOSTERONE CYPIONATE) 200 MG/ML injection INJECT 1 ML INTRAMUSCULARLY EVERY 14 DAYS 6 mL 0  . montelukast (SINGULAIR) 10 MG tablet Take 1 tablet (10 mg total) by mouth at bedtime. 30 tablet 0   No current facility-administered medications on file prior to visit.      Past Medical History:  Diagnosis Date  . Barrett esophagus 05/04/2012  . Elevated blood pressure reading without diagnosis of hypertension 09/23/2014  . GERD (gastroesophageal reflux disease) 04/06/2011  . Hernia, umbilical 04/06/2011  .  Hypercholesterolemia    recommended statin 07/2018  . Hypogonadism in male 02/09/2011  . OSA (obstructive sleep apnea)    suspected: referred to pulm/sleep med by Dr. Claiborne Billings 08/2017.  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  . Seasonal allergic rhinitis   . 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 Hydrocodone Other (See Comments)    Patient uuable to breathe  . Ciprofloxacin   . Clavulanic Acid Rash    Social History   Socioeconomic History  . Marital status: Unknown    Spouse name: Not on file  . Number of children: 2  . Years of education: Not on file  . Highest education level: Not on file  Occupational History  . Occupation: Chief Operating Officer: [employer]  Social Needs  . Financial resource strain: Not on file  . Food insecurity:    Worry: Not on file    Inability: Not on file  . Transportation needs:    Medical: Not on file    Non-medical: Not on file  Tobacco Use  . Smoking status: Never Smoker  . Smokeless tobacco: Former Engineer, water and Sexual Activity  . Alcohol use: No    Alcohol/week: 0.0 standard drinks    Comment: socially  . Drug use: No  . Sexual activity: Yes  Lifestyle  . Physical activity:    Days per week: Not on file    Minutes per session: Not on file  . Stress: Not on file  Relationships  . Social connections:    Talks on phone: Not on file    Gets together: Not on file    Attends religious service: Not on file    Active member of club or organization: Not on file    Attends meetings of clubs or organizations: Not on file    Relationship status: Not on file  Other Topics Concern  . Not on file  Social History Narrative   Divorced, several children.   Has girlfriend who lives in [place], [place].   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   No T/A/Ds.    Vitals:   11/08/18 0908 11/08/18 0943  BP: (!) 160/100 (!) 150/80  Pulse: (!) 104   Resp: 12   Temp: 98.5 F (36.9 C)   SpO2: 99%    Body mass index is 27.87 kg/m.  Physical Exam  Nursing note and vitals reviewed. Constitutional: He is oriented to person, place, and time. He appears well-developed. He does not appear ill. No  distress.  HENT:  Head: Normocephalic and atraumatic.  Nose: Rhinorrhea present. Right sinus exhibits no maxillary sinus tenderness and no frontal sinus tenderness. Left sinus exhibits no maxillary sinus tenderness and no frontal sinus tenderness.  Mouth/Throat: Uvula is midline and mucous membranes are normal. No oral lesions. No trismus in the jaw. Uvula swelling present. Posterior oropharyngeal erythema present. No oropharyngeal exudate, posterior oropharyngeal edema or tonsillar abscesses.  Eyes: Conjunctivae and EOM are normal.  Neck: No muscular tenderness present. No edema and no erythema present.  Cardiovascular: Normal rate and regular rhythm.  No murmur heard. HR by my count 96/min  Respiratory: Effort normal and breath sounds normal. No stridor. No respiratory distress.  Lymphadenopathy:       Head (right side): No submandibular adenopathy present.       Head (left side): No submandibular adenopathy present.  He has cervical adenopathy.       Right cervical: Posterior cervical adenopathy present.  Neurological: He is alert and oriented to person, place, and time. He has normal strength.  Skin: Skin is warm. No rash noted. No erythema.  Psychiatric: He has a normal mood and affect. His speech is normal.  Well groomed, good eye contact.    ASSESSMENT AND PLAN:  Ryan Dodson was seen today for enlarged uvula.  Diagnoses and all orders for this visit:  Uvular swelling ?  Infectious. No stridor and reporting some improvement. After discussion of some side effects, including elevation of BP, he agrees with taking a short course of prednisone. He was clearly instructed about warning signs, he voices understanding. Follow-up with PCP in 48 hours if needed.  -     POC Rapid Strep A -     predniSONE (DELTASONE) 20 MG tablet; Take 2 tablets (40 mg total) by mouth daily with breakfast for 3 days.  Acute pharyngitis, unspecified etiology Rapid strep here in the office  negative. Because no insurance, he does not want strep culture done. Recommend amoxicillin 500 mg for 10 days. Symptomatic treatment with gargles with salty water and throat lozenges.  -     amoxicillin (AMOXIL) 500 MG capsule; Take 1 capsule (500 mg total) by mouth 2 (two) times daily for 10 days.  Elevated blood pressure reading Rechecked, mildly improved but still elevated. We discussed diagnostic criteria for hypertension. Given the fact he is having adequate BP numbers at home, I do not recommend antihypertensive medication today. Low-salt diet recommended. Continue monitoring BP at home. Clearly instructed about warning signs.    Return if symptoms worsen or fail to improve.      G. [place], MD  [facility]. [place] office.

## 2018-11-08 NOTE — Patient Instructions (Addendum)
  [name] I have seen you today for an acute visit.  A few things to remember from today's visit:   Uvular swelling - Plan: POC Rapid Strep A  Acute pharyngitis, unspecified etiology  Elevated blood pressure reading  Prednisone "elevated blood pressure, so continue monitoring blood pressure at home.  Symptomatic treatment: Over the counter Acetaminophen 500 mg and/or Ibuprofen (400-600 mg) if there is not contraindications; you can alternate in between both every 4-6 hours. Gargles with saline water and throat lozenges might also help. Cold fluids.    Seek prompt medical evaluation if you are having difficulty breathing, mouth swelling, throat closing up, not able to swallow liquids (drooling), skin rash/bruising, or worsening symptoms.  Please follow up in 1-2 weeks if not any better.     In general please monitor for signs of worsening symptoms and seek immediate medical attention if any concerning.   I hope you get better soon!

## 2018-11-09 ENCOUNTER — Encounter: Payer: Self-pay | Admitting: Family Medicine

## 2018-11-09 ENCOUNTER — Ambulatory Visit: Payer: Self-pay | Admitting: Family Medicine

## 2018-11-09 NOTE — Telephone Encounter (Signed)
Agree; thx 

## 2018-11-09 NOTE — Telephone Encounter (Signed)
Pt advised that he needs to schedule ov with Dr. Milinda Cave.   Apt made for 11/11/18 at 11:00am.

## 2018-11-09 NOTE — Telephone Encounter (Signed)
Pt called in concerned about his BP running slightly high for him.   No history of hypertension or being on BP medications ever.    See triage notes.   Per the protocol he needs an appt within 2 weeks since he is asymptomatic however he has request I send Dr. Milinda Cave a note with his BP concerns.    The pt also sent Dr. Milinda Cave a Mychart note too.  I sent my triage notes to Dr. Samul Dada nurse pool for further disposition.    Reason for Disposition . [1] Systolic BP  >= 130 OR Diastolic >= 80 AND [2] not taking BP medications  Answer Assessment - Initial Assessment Questions 1. BLOOD PRESSURE: "What is the blood pressure?" "Did you take at least two measurements 5 minutes apart?"     150/80 yesterday at [facility] [place].    2. ONSET: "When did you take your blood pressure?"     Yesterday 3. HOW: "How did you obtain the blood pressure?" (e.g., visiting nurse, automatic home BP monitor)     See above 4. HISTORY: "Do you have a history of high blood pressure?"     No 5. MEDICATIONS: "Are you taking any medications for blood pressure?" "Have you missed any doses recently?"     No 6. OTHER SYMPTOMS: "Do you have any symptoms?" (e.g., headache, chest pain, blurred vision, difficulty breathing, weakness)     Feel great. 7. PREGNANCY: "Is there any chance you are pregnant?" "When was your last menstrual period?"     N/A  Protocols used: HIGH BLOOD PRESSURE-A-AH

## 2018-11-11 ENCOUNTER — Encounter: Payer: Self-pay | Admitting: Family Medicine

## 2018-11-11 ENCOUNTER — Other Ambulatory Visit: Payer: Self-pay

## 2018-11-11 ENCOUNTER — Ambulatory Visit: Payer: Self-pay | Admitting: Family Medicine

## 2018-11-11 VITALS — BP 149/79 | HR 89 | Temp 97.6°F | Resp 12 | Ht 70.25 in | Wt 193.8 lb

## 2018-11-11 DIAGNOSIS — I10 Essential (primary) hypertension: Secondary | ICD-10-CM

## 2018-11-11 MED ORDER — LISINOPRIL 5 MG PO TABS: 5.0000 mg | ORAL_TABLET | Freq: Every day | ORAL | 0 refills | Status: DC

## 2018-11-11 NOTE — Progress Notes (Signed)
OFFICE VISIT  11/11/2018   CC:  Chief Complaint  Patient presents with  . Hypertension    HPI:    Patient is a 58 y.o.  male with hx of elevated bp w/out dx of HTN who presents for concern about recent bp elevations. Under a lot of stress lately. Went to MD office elsewhere earlier in week and bp 150/90 (in context of swollen uvula).  BP 130-134/80 on checks the next couple days.  Today 149/79 in office here today.  ROS: no CP, no SOB, no wheezing, no cough, no dizziness, no HAs, no rashes, no melena/hematochezia.  No polyuria or polydipsia.  No myalgias or arthralgias.   Past Medical History:  Diagnosis Date  . Barrett esophagus 05/04/2012  . Elevated blood pressure reading without diagnosis of hypertension 09/23/2014  . GERD (gastroesophageal reflux disease) 04/06/2011  . Hernia, umbilical 04/06/2011  . Hypercholesterolemia    recommended statin 07/2018  . Hypogonadism in male 02/09/2011  . OSA (obstructive sleep apnea)    suspected: referred to pulm/sleep med by Dr. Claiborne Billings 08/2017.  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  . Seasonal allergic rhinitis   . 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 ESOPHAGOGASTRODUODENOSCOPY  05/14/11   Done for epigastric pain: distal esoph diverticulum, Barrett's esoph at GE jxn, hiatal hernia  . INGUINAL HERNIA REPAIR     on right, mesh in place  . VASECTOMY  2001    Outpatient Medications Prior to Visit  Medication Sig Dispense Refill  . amoxicillin (AMOXIL) 500 MG capsule Take 1 capsule (500 mg total) by mouth 2 (two) times daily for 10 days. 20 capsule 0  . B-D 3CC LUER-LOK SYR 22GX1-1/2 22G X 1-1/2" 3 ML MISC 1 DOSE IM EVERY 2 WEEKS FOR TESTOSTERONE INJECTION 50 each 0  . fexofenadine (ALLEGRA) 180 MG tablet Take 180 mg by mouth daily.    Marland Kitchen ibuprofen (ADVIL,MOTRIN) 200 MG tablet Take 200  mg by mouth every 6 (six) hours as needed.    Marland Kitchen omeprazole (PRILOSEC OTC) 20 MG tablet Take 1 tablet by mouth daily.    . sildenafil (REVATIO) 20 MG tablet 2-3 tabs po qd prn 30 tablet 3  . testosterone cypionate (DEPOTESTOSTERONE CYPIONATE) 200 MG/ML injection INJECT 1 ML INTRAMUSCULARLY EVERY 14 DAYS 6 mL 0  . montelukast (SINGULAIR) 10 MG tablet Take 1 tablet (10 mg total) by mouth at bedtime. 30 tablet 0  . predniSONE (DELTASONE) 20 MG tablet Take 2 tablets (40 mg total) by mouth daily with breakfast for 3 days. 6 tablet 0   No facility-administered medications prior to visit.     Allergies  Allergen Reactions  . Hydrocodone Other (See Comments)    Patient uuable to breathe  . Ciprofloxacin   . Clavulanic Acid Rash    ROS As per HPI  PE: Blood pressure (!) 149/79, pulse 89, temperature 97.6 F (36.4 C), temperature source Oral, resp. rate 12, height 5' 10.25" (1.784 m), weight 193 lb 12.8 oz (87.9 kg), SpO2 99 %. Gen: Alert, well appearing.  Patient is oriented to person, place, time, and situation. AFFECT: pleasant, lucid thought and speech. No further exam today.  LABS:    Chemistry      Component Value Date/Time   NA 136 08/04/2018 1600   K 4.4 08/04/2018 1600   CL 99 08/04/2018 1600  CO2 27 08/04/2018 1600   BUN 19 08/04/2018 1600   CREATININE 1.16 08/04/2018 1600   CREATININE 1.42 (H) 10/03/2015 1534      Component Value Date/Time   CALCIUM 9.7 08/04/2018 1600   ALKPHOS 53 08/04/2018 1600   AST 52 (H) 08/04/2018 1600   ALT 68 (H) 08/04/2018 1600   BILITOT 0.3 08/04/2018 1600     Lab Results  Component Value Date   TESTOSTERONE 212.90 (L) 08/04/2018   Lab Results  Component Value Date   WBC 11.1 R (H) 08/04/2018   HGB 15.5 08/04/2018   HCT 48.5 08/04/2018   MCV 85.4 08/04/2018   PLT 372.0 08/04/2018   Lab Results  Component Value Date   TSH 1.72 08/04/2018    IMPRESSION AND PLAN:  1) HTN, new dx, but I suspect he has had this for a while and  he simply has not wanted to take med for it! He is willing to start trial of lisinopril 5mg  qd today.   Monitor bp/hr. He goes to [place] next week for a while. He'll f/u to review bps in office in 3 wks. Repeat BMET at f/u 3 wks.  An After Visit Summary was printed and given to the patient.  FOLLOW UP: Return in about 3 weeks (around 12/02/2018) for f/u HTN.  Signed:  Santiago Bumpers, MD           11/11/2018

## 2018-12-23 ENCOUNTER — Other Ambulatory Visit: Payer: Self-pay

## 2018-12-23 MED ORDER — TESTOSTERONE CYPIONATE 200 MG/ML IM SOLN: INTRAMUSCULAR | 1 refills | Status: DC

## 2018-12-23 NOTE — Telephone Encounter (Signed)
Patient notified of refill.

## 2018-12-23 NOTE — Telephone Encounter (Signed)
RF request for Testosterone  LOV:  11/11/18 Next ov: none Last written: 10/24/18 59mL w/0RF  Pt is requesting 90 d supply, 60mL instead of 20mL. Please advise if appropriate to fill.

## 2018-12-23 NOTE — Telephone Encounter (Signed)
RF'd as per pt request.

## 2019-02-28 ENCOUNTER — Ambulatory Visit (INDEPENDENT_AMBULATORY_CARE_PROVIDER_SITE_OTHER): Payer: Self-pay | Admitting: Family Medicine

## 2019-02-28 ENCOUNTER — Other Ambulatory Visit: Payer: Self-pay

## 2019-02-28 ENCOUNTER — Encounter: Payer: Self-pay | Admitting: Family Medicine

## 2019-02-28 VITALS — BP 126/78 | HR 85 | Temp 98.0°F | Resp 12 | Ht 70.25 in | Wt 190.4 lb

## 2019-02-28 DIAGNOSIS — E291 Testicular hypofunction: Secondary | ICD-10-CM

## 2019-02-28 DIAGNOSIS — Z9119 Patient's noncompliance with other medical treatment and regimen: Secondary | ICD-10-CM

## 2019-02-28 DIAGNOSIS — I8002 Phlebitis and thrombophlebitis of superficial vessels of left lower extremity: Secondary | ICD-10-CM

## 2019-02-28 DIAGNOSIS — Z91199 Patient's noncompliance with other medical treatment and regimen due to unspecified reason: Secondary | ICD-10-CM

## 2019-02-28 DIAGNOSIS — I10 Essential (primary) hypertension: Secondary | ICD-10-CM

## 2019-02-28 NOTE — Progress Notes (Signed)
OFFICE VISIT  02/28/2019   CC:  Chief Complaint  Patient presents with  . blood clot    left leg   HPI:    Patient is a 58 y.o.  male who presents for left leg complaint.  He went off his  bp med for a couple weeks b/c he   He went up to 1.60ml testost q 14d about 1 mo to get more energy.  I warned him agains this.  He noted a 6 inch linear region of pain in medial L thigh about 4 d/a. Slightly red over the area, felt like a "heat" kind of pain.  He restarted his bp med again and he says it got better.  It is almost gone now.  He never had any LL edema or diffuse swelling.  No ice or heat has been applied. No motrin/tyl taken.  No hx of DVT, no FH of DVT, no recent prolonged immobilization or recent surgical procedure.  ROS: no CP, no SOB, no wheezing, no cough, no dizziness, no HAs, no rashes, no melena/hematochezia.  No polyuria or polydipsia.  No myalgias or arthralgias.   Past Medical History:  Diagnosis Date  . Barrett esophagus 05/04/2012  . Essential hypertension 09/23/2014  . GERD (gastroesophageal reflux disease) 04/06/2011  . Hernia, umbilical 10/04/2681  . Hypercholesterolemia    recommended statin 07/2018  . Hypogonadism in male 02/09/2011  . OSA (obstructive sleep apnea)    suspected: referred to pulm/sleep med by Dr. Raoul Pitch 08/2017.  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  . Seasonal allergic rhinitis   . 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 ESOPHAGOGASTRODUODENOSCOPY  05/14/11   Done for epigastric pain: distal esoph diverticulum, Barrett's esoph at GE jxn, hiatal hernia  . INGUINAL HERNIA REPAIR     on right, mesh in place  . VASECTOMY  2001    Outpatient Medications Prior to Visit  Medication Sig Dispense Refill  . B-D 3CC LUER-LOK SYR 22GX1-1/2 22G X 1-1/2" 3 ML MISC 1 DOSE IM EVERY 2 WEEKS FOR  TESTOSTERONE INJECTION 50 each 0  . fexofenadine (ALLEGRA) 180 MG tablet Take 180 mg by mouth daily.    Marland Kitchen lisinopril (PRINIVIL,ZESTRIL) 5 MG tablet Take 1 tablet (5 mg total) by mouth daily. 30 tablet 0  . omeprazole (PRILOSEC OTC) 20 MG tablet Take 1 tablet by mouth daily.    . sildenafil (REVATIO) 20 MG tablet 2-3 tabs po qd prn 30 tablet 3  . testosterone cypionate (DEPOTESTOSTERONE CYPIONATE) 200 MG/ML injection INJECT 1 ML INTRAMUSCULARLY EVERY 14 DAYS 10 mL 1  . ibuprofen (ADVIL,MOTRIN) 200 MG tablet Take 200 mg by mouth every 6 (six) hours as needed.    . montelukast (SINGULAIR) 10 MG tablet Take 1 tablet (10 mg total) by mouth at bedtime. 30 tablet 0   No facility-administered medications prior to visit.     Allergies  Allergen Reactions  . Hydrocodone Other (See Comments)    Patient uuable to breathe  . Ciprofloxacin   . Clavulanic Acid Rash    ROS As per HPI  PE: Blood pressure 126/78, pulse 85, temperature 98 F (36.7 C), temperature source Temporal, resp. rate 12, height 5' 10.25" (1.784 m), weight 190 lb 6.4 oz (86.4 kg), SpO2 99 %. Gen: Alert, well appearing.  Patient is oriented to person, place, time, and situation. AFFECT:  pleasant, lucid thought and speech. Legs: very muscular.  No asymmetry or edema. Left thigh medial aspect with about a 4 inch palpable superficial vein that is neither warm or red. It is mildly tender with firm palpation of the area. Homan's neg.    LABS:  Lab Results  Component Value Date   WBC 11.1 R (H) 08/04/2018   HGB 15.5 08/04/2018   HCT 48.5 08/04/2018   MCV 85.4 08/04/2018   PLT 372.0 08/04/2018     Chemistry      Component Value Date/Time   NA 136 08/04/2018 1600   K 4.4 08/04/2018 1600   CL 99 08/04/2018 1600   CO2 27 08/04/2018 1600   BUN 19 08/04/2018 1600   CREATININE 1.16 08/04/2018 1600   CREATININE 1.42 (H) 10/03/2015 1534      Component Value Date/Time   CALCIUM 9.7 08/04/2018 1600   ALKPHOS 53 08/04/2018  1600   AST 52 (H) 08/04/2018 1600   ALT 68 (H) 08/04/2018 1600   BILITOT 0.3 08/04/2018 1600     Lab Results  Component Value Date   PSA 1.42 08/04/2018   PSA 2.17 02/12/2017   PSA 1.09 10/03/2015   Lab Results  Component Value Date   TESTOSTERONE 212.90 (L) 08/04/2018    IMPRESSION AND PLAN:  1) Left medial thigh superficial phlebitis. Reassured; recommended ibuprofen 600 mg bid with food x 7-10d or until all sx's gone. Signs/symptoms to call or return for were reviewed and pt expressed understanding.  2) HTN, some noncompliance with bp med, but now doing well back on it. BP normal today.  3) Hypogonadism: I warned him against INCREASING his dose on his own in order to get more energy. He does report that he got Hb check when he donated blood 3 d/a and it was 17.4. I recommended he go back to the 1 ml q2 week dosing of testost injection. He expressed understanding and agreement with plan.  An After Visit Summary was printed and given to the patient.  FOLLOW UP: Return if symptoms worsen or fail to improve.  Signed:  Santiago BumpersPhil , MD           02/28/2019

## 2019-03-06 ENCOUNTER — Other Ambulatory Visit: Payer: Self-pay | Admitting: Family Medicine

## 2019-03-07 MED ORDER — LISINOPRIL 5 MG PO TABS: 5.0000 mg | ORAL_TABLET | Freq: Every day | ORAL | 1 refills | Status: DC

## 2019-05-14 ENCOUNTER — Other Ambulatory Visit: Payer: Self-pay | Admitting: Family Medicine

## 2019-07-04 ENCOUNTER — Encounter: Payer: Self-pay | Admitting: Family Medicine

## 2019-07-05 ENCOUNTER — Other Ambulatory Visit: Payer: Self-pay

## 2019-07-05 MED ORDER — LISINOPRIL 10 MG PO TABS: 10.0000 mg | ORAL_TABLET | Freq: Every day | ORAL | 1 refills | Status: DC

## 2019-07-05 NOTE — Telephone Encounter (Signed)
Called patient to verify pharmacy. Sent in bp medication to pharmacy. Patient will keep his appointment that he has on 11/11 with Dr. Anitra Lauth to discuss his bp issues.

## 2019-07-05 NOTE — Telephone Encounter (Signed)
Patient sent the following mychart message back to me "Did Dr. Anitra Lauth read my message at all, or did you respond back to me without giving to him?  I emailed him, not you, no offense, but I like my years of relationship with Dr. Anitra Lauth.". patient is having issues with his BP. I advised that he should make an appointment to discuss with Dr. Anitra Lauth. Patient seems upset. Please advise. Thank you.

## 2019-07-05 NOTE — Telephone Encounter (Signed)
Pt is on a low dose of lisinopril. It is very much normal to require a higher dose to adequately control high blood pressure. Reassure him that working out like he does is helping his overall cardiovascular health A LOT, even if it is not driving his bp down much. I know it's frustrating but this is the norm for patient's with high blood pressure. I advise taking TWO of the 5mg  tabs once a day until he runs out of these. Pls eRx lisinopril 10mg , 1 tab po qd, #30, RF x 1 for him to take after running out of his 5mg  tabs. Check bp like he is doing. Arrange f/u visit (telemed or in office, whichever he prefers) in 2-3 wks.-thx

## 2019-07-12 ENCOUNTER — Encounter: Payer: Self-pay | Admitting: Family Medicine

## 2019-07-12 ENCOUNTER — Ambulatory Visit: Payer: Self-pay | Admitting: Family Medicine

## 2019-07-12 ENCOUNTER — Other Ambulatory Visit: Payer: Self-pay

## 2019-07-12 VITALS — BP 131/82 | HR 89 | Temp 98.5°F | Resp 16 | Ht 70.0 in | Wt 193.4 lb

## 2019-07-12 DIAGNOSIS — Z125 Encounter for screening for malignant neoplasm of prostate: Secondary | ICD-10-CM

## 2019-07-12 DIAGNOSIS — I10 Essential (primary) hypertension: Secondary | ICD-10-CM

## 2019-07-12 DIAGNOSIS — E7800 Pure hypercholesterolemia, unspecified: Secondary | ICD-10-CM

## 2019-07-12 DIAGNOSIS — E291 Testicular hypofunction: Secondary | ICD-10-CM

## 2019-07-12 DIAGNOSIS — G4733 Obstructive sleep apnea (adult) (pediatric): Secondary | ICD-10-CM

## 2019-07-12 NOTE — Progress Notes (Signed)
OFFICE VISIT  07/12/2019   CC:  Chief Complaint  Patient presents with  . Hypertension    upped medication. stated that it has helped him. he thinks sleep apnea is causing him to have high bp   HPI:    Patient is a 58 y.o. Caucasian male who presents for f/u HTN, HLD, hypogonadism. Feeling fine other than some recent bp elevation that prompted me to recommended inc lisinopril to 10mg  qd.  He found that he felt some dizziness on this dose, bp was 130/80 at that time.  He finds that taking 5 mg qAM and 1/2 of 5mg  later in the day elicits no sx's and also controls bp fine.  He notes long hx of snoring, has been waking up in night gasping briefly and having odd dreams.  Has been told he has apneic events in sleep for a long time.  He has made arrangements to get a sleep study by purchasing this "online".  Has not seen a sleep specialist, has never had sleep study in the past.  Most recent testost inje was 4 d/a.  61ml q14d.    Says his diet is pretty good.  He lifts weights and does CV exercise every day.  ROS: no CP, no SOB, no wheezing, no cough, no dizziness, no HAs, no rashes, no melena/hematochezia.  No polyuria or polydipsia.  No myalgias or arthralgias.   Past Medical History:  Diagnosis Date  . Barrett esophagus 05/04/2012  . Essential hypertension 09/23/2014  . GERD (gastroesophageal reflux disease) 04/06/2011  . Hernia, umbilical 04/06/2011  . Hypercholesterolemia    recommended statin 07/2018  . Hypogonadism in male 02/09/2011  . OSA (obstructive sleep apnea)    suspected: referred to pulm/sleep med by Dr. 08/2018 08/2017.  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  . Seasonal allergic rhinitis   . 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 ESOPHAGOGASTRODUODENOSCOPY  05/14/11   Done for epigastric pain: distal esoph diverticulum,  Barrett's esoph at GE jxn, hiatal hernia  . INGUINAL HERNIA REPAIR     on right, mesh in place  . VASECTOMY  2001    Outpatient Medications Prior to Visit  Medication Sig Dispense Refill  . B-D 3CC LUER-LOK SYR 22GX1-1/2 22G X 1-1/2" 3 ML MISC 1 DOSE IM EVERY 2 WEEKS FOR TESTOSTERONE INJECTION 50 each 0  . fexofenadine (ALLEGRA) 180 MG tablet Take 180 mg by mouth daily.    05/16/11 lisinopril (ZESTRIL) 10 MG tablet Take 1 tablet (10 mg total) by mouth daily. 30 tablet 1  . omeprazole (PRILOSEC OTC) 20 MG tablet Take 1 tablet by mouth daily.    . sildenafil (REVATIO) 20 MG tablet 2-3 tabs po qd prn 30 tablet 3  . testosterone cypionate (DEPOTESTOSTERONE CYPIONATE) 200 MG/ML injection INJECT 1 ML INTRAMUSCULARLY EVERY 14 DAYS 10 mL 1  . ibuprofen (ADVIL,MOTRIN) 200 MG tablet Take 200 mg by mouth every 6 (six) hours as needed.     No facility-administered medications prior to visit.     Allergies  Allergen Reactions  . Hydrocodone Other (See Comments)    Patient uuable to breathe  . Ciprofloxacin   . Clavulanic Acid Rash    ROS As per HPI  PE: Blood pressure 131/82, pulse 89, temperature 98.5 F (36.9 C), temperature source Temporal, resp. rate 16, height 5\' 10"  (1.778 m),  weight 193 lb 6 oz (87.7 kg), SpO2 96 %. Body mass index is 27.75 kg/m.  Gen: Alert, well appearing.  Patient is oriented to person, place, time, and situation. AFFECT: pleasant, lucid thought and speech. ZWC:HENI: no injection, icteris, swelling, or exudate.  EOMI, PERRLA. Mouth: lips without lesion/swelling.  Oral mucosa pink and moist. Oropharynx without erythema, exudate, or swelling.  CV: RRR, no m/r/g.   LUNGS: CTA bilat, nonlabored resps, good aeration in all lung fields. EXT: no clubbing or cyanosis.   no edema.    LABS:  Lab Results  Component Value Date   TSH 1.72 08/04/2018   Lab Results  Component Value Date   WBC 11.1 R (H) 08/04/2018   HGB 15.5 08/04/2018   HCT 48.5 08/04/2018   MCV 85.4  08/04/2018   PLT 372.0 08/04/2018   Lab Results  Component Value Date   CREATININE 1.16 08/04/2018   BUN 19 08/04/2018   NA 136 08/04/2018   K 4.4 08/04/2018   CL 99 08/04/2018   CO2 27 08/04/2018   Lab Results  Component Value Date   ALT 68 (H) 08/04/2018   AST 52 (H) 08/04/2018   ALKPHOS 53 08/04/2018   BILITOT 0.3 08/04/2018   Lab Results  Component Value Date   CHOL 235 (H) 08/04/2018   Lab Results  Component Value Date   HDL 28.00 (L) 08/04/2018   Lab Results  Component Value Date   LDLCALC 109 (H) 11/03/2013   Lab Results  Component Value Date   TRIG 241.0 (H) 08/04/2018   Lab Results  Component Value Date   CHOLHDL 8 08/04/2018   Lab Results  Component Value Date   PSA 1.42 08/04/2018   PSA 2.17 02/12/2017   PSA 1.09 10/03/2015   Lab Results  Component Value Date   TESTOSTERONE 212.90 (L) 08/04/2018    IMPRESSION AND PLAN:  1) HTN: seems fine now on 5mg  lisinopril qAM and 2.5mg  qpm. Reiterated goal of <130/80. Low Na intake.  Continue excellent exercise habits. Lytes/cr today.    2) HLD: has declined statin in the past. He is not fasting today so he'll return for fasting lab visit in future to check FLP. Hepatic panel today.  3) Male hypogonadism: last test inj 4 d/a. Check testost level today. Monitor CBC and PSA.  4) Prostate ca screening: he declined DRE.  PSA level drawn today.  5) OSA: suspected.  This may be the culprit causing more bp control probs lately as well as recent HR elevation in to 80s-90s consistently. He wants to proceed with "online" sleep study b/c he cannot afford to see sleep specialist and get sleep study through them.    An After Visit Summary was printed and given to the patient.  FOLLOW UP: Return in about 6 months (around 01/09/2020) for routine chronic illness f/u.  Signed:  Crissie Sickles, MD           07/12/2019

## 2019-07-13 LAB — CBC WITH DIFFERENTIAL/PLATELET
Basophils Absolute: 0.1 10*3/uL (ref 0.0–0.1)
Basophils Relative: 0.9 % (ref 0.0–3.0)
Eosinophils Absolute: 0.3 10*3/uL (ref 0.0–0.7)
Eosinophils Relative: 3.2 % (ref 0.0–5.0)
HCT: 46.6 % (ref 39.0–52.0)
Hemoglobin: 15 g/dL (ref 13.0–17.0)
Lymphocytes Relative: 22.3 % (ref 12.0–46.0)
Lymphs Abs: 2 10*3/uL (ref 0.7–4.0)
MCHC: 32.2 g/dL (ref 30.0–36.0)
MCV: 80 fl (ref 78.0–100.0)
Monocytes Absolute: 1.1 10*3/uL — ABNORMAL HIGH (ref 0.1–1.0)
Monocytes Relative: 11.9 % (ref 3.0–12.0)
Neutro Abs: 5.6 10*3/uL (ref 1.4–7.7)
Neutrophils Relative %: 61.7 % (ref 43.0–77.0)
Platelets: 320 10*3/uL (ref 150.0–400.0)
RBC: 5.82 Mil/uL — ABNORMAL HIGH (ref 4.22–5.81)
RDW: 16.4 % — ABNORMAL HIGH (ref 11.5–15.5)
WBC: 9 10*3/uL (ref 4.0–10.5)

## 2019-07-13 LAB — COMPREHENSIVE METABOLIC PANEL
ALT: 52 U/L (ref 0–53)
AST: 49 U/L — ABNORMAL HIGH (ref 0–37)
Albumin: 4.7 g/dL (ref 3.5–5.2)
Alkaline Phosphatase: 62 U/L (ref 39–117)
BUN: 19 mg/dL (ref 6–23)
CO2: 27 mEq/L (ref 19–32)
Calcium: 9.4 mg/dL (ref 8.4–10.5)
Chloride: 100 mEq/L (ref 96–112)
Creatinine, Ser: 1.19 mg/dL (ref 0.40–1.50)
GFR: 62.43 mL/min (ref >60.00)
Glucose, Bld: 85 mg/dL (ref 70–99)
Potassium: 4.3 mEq/L (ref 3.5–5.1)
Sodium: 136 mEq/L (ref 135–145)
Total Bilirubin: 0.3 mg/dL (ref 0.2–1.2)
Total Protein: 7.5 g/dL (ref 6.0–8.3)

## 2019-07-13 LAB — PSA: PSA: 1.75 ng/mL (ref 0.10–4.00)

## 2019-07-13 LAB — TSH: TSH: 1.76 u[IU]/mL (ref 0.35–4.50)

## 2019-07-13 LAB — TESTOSTERONE: Testosterone: 725.23 ng/dL (ref 300.00–890.00)

## 2019-08-22 ENCOUNTER — Encounter: Payer: Self-pay | Admitting: Family Medicine

## 2019-08-23 ENCOUNTER — Other Ambulatory Visit: Payer: Self-pay

## 2019-08-23 MED ORDER — TADALAFIL 5 MG PO TABS: ORAL_TABLET | ORAL | 3 refills | Status: DC

## 2019-08-23 MED ORDER — TESTOSTERONE CYPIONATE 200 MG/ML IM SOLN: INTRAMUSCULAR | 0 refills | Status: DC

## 2019-08-23 NOTE — Progress Notes (Signed)
Pts pharmacy updated.

## 2019-08-23 NOTE — Telephone Encounter (Signed)
RF request for Testosterone  LOV: 07/12/19 Next ov: advised to follow up in May Last written: 12/23/18 (51mL,1)  He currently takes Sildenafil 20mg  but would like to try Tadalafil 5mg  instead for a 30 or 60 day supply. Pharmacy he would like to use has been updated. Please advise, thanks.

## 2019-08-23 NOTE — Telephone Encounter (Signed)
I wrote a message to pharmacist on the testosterone rx to please dispense 10 ml vial.

## 2019-10-24 ENCOUNTER — Encounter: Payer: Self-pay | Admitting: Family Medicine

## 2019-10-24 NOTE — Telephone Encounter (Signed)
Requesting: testosterone Contract:n/a UDS:n/a Last Visit:07/12/19 Next Visit: advised to f/u May Last Refill:08/22/20(72mL,0) pt received 5 of 23ml bottles. Last testosterone check was 07/12/19  Please Advise

## 2019-10-25 MED ORDER — TESTOSTERONE CYPIONATE 200 MG/ML IM SOLN: INTRAMUSCULAR | 0 refills | Status: DC

## 2019-10-25 NOTE — Telephone Encounter (Signed)
OK testost eRx'd

## 2019-10-26 MED ORDER — TESTOSTERONE CYPIONATE 200 MG/ML IM SOLN: INTRAMUSCULAR | 0 refills | Status: AC

## 2019-10-26 NOTE — Telephone Encounter (Signed)
Called local pharmacy and cancelled previous RX

## 2019-10-26 NOTE — Telephone Encounter (Signed)
This was sent to wrong pharmacy.  Please send to his updated preferred pharmacy that is listed on his profile.  [facility] [address]  Thank you!

## 2019-11-29 ENCOUNTER — Other Ambulatory Visit: Payer: Self-pay | Admitting: Family Medicine

## 2020-01-03 ENCOUNTER — Ambulatory Visit: Payer: Self-pay | Admitting: Family Medicine

## 2020-07-22 ENCOUNTER — Telehealth: Payer: Self-pay

## 2020-07-22 MED ORDER — SILDENAFIL CITRATE 20 MG PO TABS: ORAL_TABLET | ORAL | 0 refills | Status: DC

## 2020-07-22 MED ORDER — SILDENAFIL CITRATE 20 MG PO TABS: ORAL_TABLET | ORAL | 0 refills | Status: AC

## 2020-07-22 NOTE — Telephone Encounter (Signed)
Pt was last seen 07/12/19 for CPE and last med change completed 08/22/19. "He currently takes Sildenafil 20mg  but would like to try Tadalafil 5mg  instead for a 30 or 60 day supply"  Please advise, thanks.

## 2020-07-22 NOTE — Telephone Encounter (Signed)
Generic viagra (sildenafil) eRx'd again.

## 2020-07-22 NOTE — Telephone Encounter (Signed)
Ok, sildenafil eRx'd

## 2020-07-22 NOTE — Telephone Encounter (Signed)
Told patient that request has been sent for Dr. Milinda Cave to review.  Patient said okay.

## 2020-07-22 NOTE — Telephone Encounter (Signed)
Misunderstanding. Patient would like to switch to generic Viagra instead. Rx for sildenafil cancelled.

## 2020-07-22 NOTE — Telephone Encounter (Signed)
Patient wants to change meds.  Cialis to Generic Viagra.  He states the Cialis is a little harder on his body, with joint pain,etc, and the Viagra does not.  [place]. If not okay to change med please call patient at [phone].   Pharmacy: [facility] [address] - [place], [place]    Thank you
